# Patient Record
Sex: Female | Born: 1960 | Race: Black or African American | Hispanic: No | Marital: Single | Smoking: Never smoker
Health system: Southern US, Community
[De-identification: ages and names within clinical notes are randomized; demographics above are authoritative.]

## PROBLEM LIST (undated history)

## (undated) DIAGNOSIS — B009 Herpesviral infection, unspecified: Secondary | ICD-10-CM

## (undated) DIAGNOSIS — Z8679 Personal history of other diseases of the circulatory system: Secondary | ICD-10-CM

## (undated) DIAGNOSIS — S4991XA Unspecified injury of right shoulder and upper arm, initial encounter: Secondary | ICD-10-CM

## (undated) DIAGNOSIS — L309 Dermatitis, unspecified: Secondary | ICD-10-CM

## (undated) DIAGNOSIS — K219 Gastro-esophageal reflux disease without esophagitis: Secondary | ICD-10-CM

## (undated) DIAGNOSIS — A539 Syphilis, unspecified: Secondary | ICD-10-CM

## (undated) DIAGNOSIS — I10 Essential (primary) hypertension: Secondary | ICD-10-CM

## (undated) DIAGNOSIS — E785 Hyperlipidemia, unspecified: Secondary | ICD-10-CM

## (undated) DIAGNOSIS — F32A Depression, unspecified: Secondary | ICD-10-CM

## (undated) DIAGNOSIS — F329 Major depressive disorder, single episode, unspecified: Secondary | ICD-10-CM

## (undated) DIAGNOSIS — I052 Rheumatic mitral stenosis with insufficiency: Secondary | ICD-10-CM

## (undated) HISTORY — DX: Dermatitis, unspecified: L30.9

## (undated) HISTORY — DX: Rheumatic mitral stenosis with insufficiency: I05.2

## (undated) HISTORY — DX: Personal history of other diseases of the circulatory system: Z86.79

## (undated) HISTORY — DX: Unspecified injury of right shoulder and upper arm, initial encounter: S49.91XA

## (undated) HISTORY — DX: Essential (primary) hypertension: I10

## (undated) HISTORY — DX: Major depressive disorder, single episode, unspecified: F32.9

## (undated) HISTORY — DX: Syphilis, unspecified: A53.9

## (undated) HISTORY — DX: Hyperlipidemia, unspecified: E78.5

## (undated) HISTORY — DX: Depression, unspecified: F32.A

## (undated) HISTORY — DX: Gastro-esophageal reflux disease without esophagitis: K21.9

---

## 1981-03-30 HISTORY — PX: FEMUR FRACTURE SURGERY: SHX633

## 2004-01-29 HISTORY — PX: ABDOMINAL HYSTERECTOMY: SHX81

## 2007-05-01 HISTORY — PX: BREAST SURGERY: SHX581

## 2008-10-29 LAB — CONVERTED CEMR LAB: Pap Smear: NORMAL

## 2010-01-03 ENCOUNTER — Encounter: Payer: Self-pay | Admitting: Pulmonary Disease

## 2010-01-05 ENCOUNTER — Encounter: Payer: Self-pay | Admitting: Cardiology

## 2010-01-07 ENCOUNTER — Encounter: Payer: Self-pay | Admitting: Cardiology

## 2010-01-13 ENCOUNTER — Ambulatory Visit: Payer: Self-pay | Admitting: Radiology

## 2010-01-13 ENCOUNTER — Emergency Department (HOSPITAL_BASED_OUTPATIENT_CLINIC_OR_DEPARTMENT_OTHER): Admission: EM | Admit: 2010-01-13 | Discharge: 2010-01-13 | Payer: Self-pay | Admitting: Emergency Medicine

## 2010-01-15 ENCOUNTER — Ambulatory Visit: Payer: Self-pay | Admitting: Family

## 2010-01-15 DIAGNOSIS — I517 Cardiomegaly: Secondary | ICD-10-CM

## 2010-01-15 DIAGNOSIS — I509 Heart failure, unspecified: Secondary | ICD-10-CM | POA: Insufficient documentation

## 2010-01-15 DIAGNOSIS — J189 Pneumonia, unspecified organism: Secondary | ICD-10-CM

## 2010-01-15 DIAGNOSIS — J45901 Unspecified asthma with (acute) exacerbation: Secondary | ICD-10-CM | POA: Insufficient documentation

## 2010-01-17 ENCOUNTER — Telehealth: Payer: Self-pay | Admitting: Family

## 2010-01-17 ENCOUNTER — Ambulatory Visit (HOSPITAL_BASED_OUTPATIENT_CLINIC_OR_DEPARTMENT_OTHER): Admission: RE | Admit: 2010-01-17 | Discharge: 2010-01-17 | Payer: Self-pay | Admitting: Internal Medicine

## 2010-01-17 ENCOUNTER — Ambulatory Visit: Payer: Self-pay | Admitting: Interventional Radiology

## 2010-01-17 ENCOUNTER — Ambulatory Visit: Payer: Self-pay | Admitting: Family

## 2010-01-21 DIAGNOSIS — F329 Major depressive disorder, single episode, unspecified: Secondary | ICD-10-CM

## 2010-01-21 DIAGNOSIS — L259 Unspecified contact dermatitis, unspecified cause: Secondary | ICD-10-CM | POA: Insufficient documentation

## 2010-01-21 DIAGNOSIS — I10 Essential (primary) hypertension: Secondary | ICD-10-CM | POA: Insufficient documentation

## 2010-01-21 DIAGNOSIS — K219 Gastro-esophageal reflux disease without esophagitis: Secondary | ICD-10-CM | POA: Insufficient documentation

## 2010-01-22 ENCOUNTER — Encounter: Payer: Self-pay | Admitting: Cardiology

## 2010-01-22 ENCOUNTER — Ambulatory Visit: Payer: Self-pay | Admitting: Cardiology

## 2010-01-22 DIAGNOSIS — R011 Cardiac murmur, unspecified: Secondary | ICD-10-CM

## 2010-01-24 ENCOUNTER — Telehealth: Payer: Self-pay | Admitting: Family

## 2010-01-27 ENCOUNTER — Ambulatory Visit: Payer: Self-pay | Admitting: Diagnostic Radiology

## 2010-01-27 ENCOUNTER — Ambulatory Visit (HOSPITAL_BASED_OUTPATIENT_CLINIC_OR_DEPARTMENT_OTHER): Admission: RE | Admit: 2010-01-27 | Discharge: 2010-01-27 | Payer: Self-pay | Admitting: Internal Medicine

## 2010-01-27 ENCOUNTER — Telehealth: Payer: Self-pay | Admitting: Family

## 2010-01-27 ENCOUNTER — Ambulatory Visit: Payer: Self-pay | Admitting: Family

## 2010-01-31 ENCOUNTER — Encounter: Payer: Self-pay | Admitting: Cardiology

## 2010-01-31 ENCOUNTER — Ambulatory Visit (HOSPITAL_COMMUNITY): Admission: RE | Admit: 2010-01-31 | Discharge: 2010-01-31 | Payer: Self-pay | Admitting: Cardiology

## 2010-01-31 ENCOUNTER — Ambulatory Visit: Payer: Self-pay

## 2010-01-31 ENCOUNTER — Ambulatory Visit: Payer: Self-pay | Admitting: Cardiology

## 2010-02-03 LAB — CONVERTED CEMR LAB
BUN: 19 mg/dL (ref 6–23)
Calcium: 9.8 mg/dL (ref 8.4–10.5)
Creatinine, Ser: 0.87 mg/dL (ref 0.40–1.20)

## 2010-02-10 ENCOUNTER — Telehealth (INDEPENDENT_AMBULATORY_CARE_PROVIDER_SITE_OTHER): Payer: Self-pay | Admitting: *Deleted

## 2010-02-11 ENCOUNTER — Ambulatory Visit: Payer: Self-pay | Admitting: Pulmonary Disease

## 2010-02-11 ENCOUNTER — Encounter: Payer: Self-pay | Admitting: Pulmonary Disease

## 2010-02-12 ENCOUNTER — Encounter (INDEPENDENT_AMBULATORY_CARE_PROVIDER_SITE_OTHER): Payer: Self-pay | Admitting: *Deleted

## 2010-02-12 ENCOUNTER — Encounter: Payer: Self-pay | Admitting: Cardiology

## 2010-02-12 ENCOUNTER — Ambulatory Visit: Payer: Self-pay | Admitting: Cardiology

## 2010-02-12 DIAGNOSIS — E785 Hyperlipidemia, unspecified: Secondary | ICD-10-CM | POA: Insufficient documentation

## 2010-02-12 DIAGNOSIS — I052 Rheumatic mitral stenosis with insufficiency: Secondary | ICD-10-CM | POA: Insufficient documentation

## 2010-02-13 ENCOUNTER — Telehealth: Payer: Self-pay | Admitting: Family

## 2010-02-14 ENCOUNTER — Telehealth: Payer: Self-pay | Admitting: Family

## 2010-02-14 ENCOUNTER — Ambulatory Visit: Payer: Self-pay | Admitting: Family

## 2010-02-14 DIAGNOSIS — J40 Bronchitis, not specified as acute or chronic: Secondary | ICD-10-CM | POA: Insufficient documentation

## 2010-02-19 ENCOUNTER — Encounter: Payer: Self-pay | Admitting: Pulmonary Disease

## 2010-02-24 ENCOUNTER — Ambulatory Visit (HOSPITAL_COMMUNITY)
Admission: RE | Admit: 2010-02-24 | Discharge: 2010-02-24 | Payer: Self-pay | Source: Home / Self Care | Admitting: Cardiology

## 2010-02-24 ENCOUNTER — Ambulatory Visit: Payer: Self-pay | Admitting: Cardiology

## 2010-02-26 ENCOUNTER — Telehealth: Payer: Self-pay | Admitting: Family

## 2010-03-04 ENCOUNTER — Telehealth: Payer: Self-pay | Admitting: Family

## 2010-03-11 ENCOUNTER — Ambulatory Visit: Payer: Self-pay | Admitting: Family

## 2010-03-11 DIAGNOSIS — R05 Cough: Secondary | ICD-10-CM

## 2010-03-11 DIAGNOSIS — M25569 Pain in unspecified knee: Secondary | ICD-10-CM

## 2010-03-12 ENCOUNTER — Encounter (INDEPENDENT_AMBULATORY_CARE_PROVIDER_SITE_OTHER): Payer: Self-pay | Admitting: *Deleted

## 2010-03-12 LAB — CONVERTED CEMR LAB
ALT: 11 units/L (ref 0–35)
Alkaline Phosphatase: 59 units/L (ref 39–117)
Cholesterol: 177 mg/dL (ref 0–200)
Indirect Bilirubin: 0.3 mg/dL (ref 0.0–0.9)
Total Protein: 6.8 g/dL (ref 6.0–8.3)
Triglycerides: 79 mg/dL (ref <150)

## 2010-03-17 ENCOUNTER — Telehealth: Payer: Self-pay | Admitting: Family

## 2010-03-17 ENCOUNTER — Ambulatory Visit: Payer: Self-pay | Admitting: Family

## 2010-03-17 ENCOUNTER — Ambulatory Visit (HOSPITAL_BASED_OUTPATIENT_CLINIC_OR_DEPARTMENT_OTHER)
Admission: RE | Admit: 2010-03-17 | Discharge: 2010-03-17 | Payer: Self-pay | Source: Home / Self Care | Attending: Internal Medicine | Admitting: Internal Medicine

## 2010-03-18 ENCOUNTER — Ambulatory Visit: Payer: Self-pay | Admitting: Pulmonary Disease

## 2010-03-19 ENCOUNTER — Telehealth: Payer: Self-pay | Admitting: Internal Medicine

## 2010-03-19 ENCOUNTER — Telehealth: Payer: Self-pay | Admitting: Pulmonary Disease

## 2010-03-21 ENCOUNTER — Ambulatory Visit: Payer: Self-pay | Admitting: Family

## 2010-03-25 ENCOUNTER — Inpatient Hospital Stay (HOSPITAL_COMMUNITY)
Admission: AD | Admit: 2010-03-25 | Discharge: 2010-03-28 | Payer: Self-pay | Source: Home / Self Care | Attending: Cardiology | Admitting: Cardiology

## 2010-03-25 ENCOUNTER — Emergency Department (HOSPITAL_BASED_OUTPATIENT_CLINIC_OR_DEPARTMENT_OTHER)
Admission: EM | Admit: 2010-03-25 | Discharge: 2010-03-25 | Payer: Self-pay | Source: Home / Self Care | Admitting: Emergency Medicine

## 2010-03-27 ENCOUNTER — Encounter: Payer: Self-pay | Admitting: Cardiology

## 2010-04-09 ENCOUNTER — Ambulatory Visit
Admission: RE | Admit: 2010-04-09 | Discharge: 2010-04-09 | Payer: Self-pay | Source: Home / Self Care | Attending: Cardiology | Admitting: Cardiology

## 2010-04-09 ENCOUNTER — Encounter: Payer: Self-pay | Admitting: Family

## 2010-04-09 ENCOUNTER — Encounter: Payer: Self-pay | Admitting: Cardiology

## 2010-04-09 ENCOUNTER — Ambulatory Visit (HOSPITAL_BASED_OUTPATIENT_CLINIC_OR_DEPARTMENT_OTHER)
Admission: RE | Admit: 2010-04-09 | Discharge: 2010-04-09 | Payer: Self-pay | Source: Home / Self Care | Attending: Cardiology | Admitting: Cardiology

## 2010-04-18 LAB — CONVERTED CEMR LAB
BUN: 22 mg/dL (ref 6–23)
CO2: 28 meq/L (ref 19–32)
Chloride: 98 meq/L (ref 96–112)
Creatinine, Ser: 1.08 mg/dL (ref 0.40–1.20)
Sed Rate: 18 mm/hr (ref 0–22)

## 2010-04-28 ENCOUNTER — Ambulatory Visit
Admission: RE | Admit: 2010-04-28 | Discharge: 2010-04-28 | Payer: Self-pay | Source: Home / Self Care | Attending: Family | Admitting: Family

## 2010-04-29 NOTE — Assessment & Plan Note (Signed)
Summary: 4 DAY FOLLOW UP/MHF rsch per pt/dt--Rm 5   Vital Signs:  Patient profile:   50 year old female Menstrual status:  partial hysterectomy Height:      62 inches Weight:      173.75 pounds BMI:     31.89 O2 Sat:      98 % on Room air Temp:     97.7 degrees F oral Pulse rate:   78 / minute Pulse rhythm:   regular Resp:     24 per minute BP sitting:   126 / 76  (right arm) Cuff size:   large  Vitals Entered By: Mervin Kung CMA (AAMA) (January 27, 2010 8:14 AM)  O2 Flow:  Room air CC: Rm 5  4 day follow up. Still has cough and rib pain. Is Patient Diabetic? No Pain Assessment Patient in pain? yes      Comments Pt agrees all med doses and directions are correct. Nicki Guadalajara Fergerson CMA (AAMA)  January 27, 2010 8:20 AM    Visit Type:  follow up Primary Care Provider:  Lemont Fillers FNP  CC:  Rm 5  4 day follow up. Still has cough and rib pain.Marland Kitchen  History of Present Illness: Ms Kulzer is a 50 year old female who presents today for follow up.  She had a CXR on 10/21 which showed bilateral pneumonia.  She was acutely wheezing at that time and was treated with a prednisone taper and antibiotics.  She completed the abx on 10/19.  She denies chills or fever.  Denies fevers.  Notes clear sputum, foam.  Thick x the last few days.  Still having cough and rib pain and pain beneath the ribs and around her back.  This pain is worse with cough.   Notes that her cough was present prior to initiation of ACE in [place].  She saw Dr. Jens Som last week and her zestril was increased from 20 to 40 mg.  She has continued furosemide and has lost 4 pounds since her initial visit on 10/19.    Allergies (verified): No Known Drug Allergies  Past History:  Past Medical History: Last updated: 01/22/2010 HYPERTENSION Hyperlipidemia ECZEMA DEPRESSION GERD  ASTHMA Hx of Rheumatic fever-- murmur Hx of right shoulder injury  Past Surgical History: Last updated: 01/15/2010 Right femur  fracture--1983 C section -- 1991 Partial Hysterectomy-- 01/2004 Left breast biopsy (benign)-- 05/2007  Family History: Reviewed history from 01/15/2010 and no changes required. Mother--living, renal failure, heart disease, thyroid disease, diabetes, colon polyps  Father--deceased, stroke; heart disease 1 brother--alive and well 1 brother deceased from complications or cellulitis 3 children-- alive and well  [place]- age 85 [name]-23 [name]-20- [name]  Social History: Reviewed history from 01/15/2010 and no changes required. 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 No apparent alopecia or balding. Lungs:  Normal respiratory effort, chest expands symmetrically. Lungs are clear to auscultation, no crackles or wheezes. Heart:  Normal rate and regular rhythm. S1 and S2 normal without gallop, murmur, click, rub or other extra sounds. Extremities:  No peripheral edema Psych:  Cognition and judgment appear intact. Alert and cooperative with normal attention span and concentration. No apparent delusions, illusions, hallucinations   Impression &  Recommendations:  Problem # 1:  CHF (ICD-428.0) Assessment Improved Patient's lungs are clear, appears euvolemic.  Continue ACE, furosemide, beta blocker.  Pt completed a follow up echo last week- results are pending.  May need valve replacement.  Will defer management to cardiology.  She has been scheduled for follow up lab work on Friday of this week by Dr. Jens Som.  (BMET) Her updated medication list for this problem includes:    Zestril 40 Mg Tabs (Lisinopril) ..... One tablet by mouth once daily     Metoprolol Tartrate 25 Mg Tabs (Metoprolol tartrate) .Marland Kitchen... Take 1 tablet by mouth twice a day    Ecotrin Low Strength 81 Mg Tbec (Aspirin) .Marland Kitchen... Take 1 tablet by mouth once a day.    Furosemide 40 Mg Tabs (Furosemide) ..... One tablet by mouth once daily  Problem # 2:  PNEUMONIA, BILATERAL (ICD-486) Assessment: Improved Clinically improved.  F/u cxr to ensure clearing. Orders: CXR- 2view (CXR)  Problem # 3:  ASTHMA, WITH ACUTE EXACERBATION (ICD-493.92) Assessment: Improved No wheezing today. Continue advair.  Pt continues to cough.  Notes + post nasal drip.  Recommended that she resume the flonase and stop the mucinex.  Continue PPI.  Plan tussionex at bedtime short term and ibuprofen for muscle pain PRN short term.  Consider PFT's next visit.   Her updated medication list for this problem includes:    Albuterol Sulfate (2.5 Mg/29ml) 0.083% Nebu (Albuterol sulfate) ..... Use 1 vial in nebulizer every 4 hours as needed.    Advair Hfa 115-21 Mcg/act Aero (Fluticasone-salmeterol) .Marland Kitchen... 2 puffs two times a day.    Proair Hfa 108 (90 Base) Mcg/act Aers (Albuterol sulfate) .Marland Kitchen... As needed  Complete Medication List: 1)  Soma 350 Mg Tabs (Carisoprodol) .... Take 1 tablet every 6 hours. 2)  Prozac 40 Mg Caps (Fluoxetine hcl) .... Take 1 capsule by mouth once a day. 3)  Estradiol 1 Mg Tabs (Estradiol) .... Take 1 tablet by mouth once a day. 4)  Flonase 50 Mcg/act Susp (Fluticasone propionate) .Marland Kitchen.. 1spray each nostril daily as needed. 5)  Albuterol Sulfate (2.5 Mg/25ml) 0.083% Nebu (Albuterol sulfate) .... Use 1 vial in nebulizer every 4 hours as needed. 6)  Advair Hfa 115-21 Mcg/act Aero (Fluticasone-salmeterol) .... 2 puffs two times a day. 7)  Zestril 40 Mg Tabs (Lisinopril) .... One tablet by mouth once daily 8)  Zocor 10 Mg Tabs (Simvastatin) .... Take 1 tablet by mouth once a day. 9)  Metoprolol Tartrate 25 Mg Tabs (Metoprolol tartrate) .... Take 1 tablet by mouth twice a day 10)  Ecotrin Low  Strength 81 Mg Tbec (Aspirin) .... Take 1 tablet by mouth once a day. 11)  Furosemide 40 Mg Tabs (Furosemide) .... One tablet by mouth once daily 12)  Nexium 40 Mg Cpdr (Esomeprazole magnesium) .... Take 1 capsule by mouth once a day 13)  Proair Hfa 108 (90 Base) Mcg/act Aers (Albuterol sulfate) .... As needed 14)  Tussionex Pennkinetic Er 10-8 Mg/15ml Lqcr (Hydrocod polst-chlorphen polst) .... One teaspoon every 12 hours as needed for severe cough.  do not drive while taking this medication  Patient Instructions: 1)  Please complete your chest x-ray today. 2)  Complete your lab work on Friday as scheduled.   3)  Take 400-600mg  of Ibuprofen (Advil, Motrin) with food every 4-6 hours as needed for relief of pain. 4)  Resume flonase. 5)  Please schedule a follow-up appointment in 1 month. Prescriptions: FUROSEMIDE 40 MG TABS (FUROSEMIDE) one tablet by mouth once  daily  #30 x 2   Entered and Authorized by:   Lemont Fillers FNP   Signed by:   Lemont Fillers FNP on 01/27/2010   Method used:   Electronically to        [facility] # [phone]* (retail)       [address]       [place], [place]  [address]       Ph: [phone]       Fax: [phone]   RxID:   [id number] FLONASE 50 MCG/ACT SUSP (FLUTICASONE PROPIONATE) 1spray each nostril daily as needed.  #1 x 3   Entered and Authorized by:   Lemont Fillers FNP   Signed by:   Lemont Fillers FNP on 01/27/2010   Method used:   Electronically to        [facility] # [phone]* (retail)       [address]       [place], [place]  [address]       Ph: [phone]       Fax: [phone]   RxID:   [id number] Sandria Senter ER 10-8 MG/5ML LQCR (HYDROCOD POLST-CHLORPHEN POLST) one teaspoon every 12 hours as needed for severe cough.  Do not drive while taking this medication  #150 x 0   Entered and Authorized by:   Lemont Fillers FNP   Signed by:    Lemont Fillers FNP on 01/27/2010   Method used:   Print then Give to Patient   RxID:   [id number]    Orders Added: 1)  CXR- 2view [CXR] 2)  Est. Patient Level III [20254]    Current Allergies (reviewed today): No known allergies

## 2010-04-29 NOTE — Progress Notes (Signed)
Summary: cough  Phone Note Call from Patient Call back at [phone]   Caller: Patient Call For: Lemont Fillers FNP Summary of Call: Received call from pt stating her cough has returned though not as bad as before, not having pain. Cough is productive of yellow sputum. Pt denies fever. Pt wants to know what she should take OTC or does she need a prescription. Pt doesn't want to get back in the condition she was before. Pt states she has upcoming cardiac procedure and wants to better for that test.  Mervin Kung CMA Duncan Dull)  February 14, 2010 11:25 AM   Follow-up for Phone Call        She needs OV please for further evaluation. Follow-up by: Lemont Fillers FNP,  February 14, 2010 11:35 AM  Additional Follow-up for Phone Call Additional follow up Details #1::        Pt notified and will see Melissa today at 1:30pm Mervin Kung CMA Duncan Dull)  February 14, 2010 11:42 AM

## 2010-04-29 NOTE — Assessment & Plan Note (Signed)
Summary: 2 day follow up/mhf--Rm 5   Vital Signs:  Patient profile:   50 year old female Menstrual status:  partial hysterectomy Height:      62 inches Weight:      177.50 pounds BMI:     32.58 O2 Sat:      95 % on Room air Temp:     97.9 degrees F oral Pulse rate:   78 / minute Pulse rhythm:   regular Resp:     26 per minute BP sitting:   144 / 96  (right arm) Cuff size:   large  Vitals Entered By: Mervin Kung CMA Duncan Dull) (January 17, 2010 8:55 AM)  O2 Flow:  Room air CC: Rm 5  2 day follow up.  Pt now has constant pain under right rib since last visit. Is Patient Diabetic? No Pain Assessment Patient in pain? yes     Location: right rib Intensity: 10 Type: constant, sharp Onset of pain  2 days ago after hard coughing spell. Comments Pt thinks Metoprolol bottle says take 1 every 8 hours. She will check this at home and call us back. Pt agrees all other med doses and directions are correct. Nicki Guadalajara Fergerson CMA (AAMA)  January 17, 2010 9:05 AM.    Primary Care Provider:  Lemont Fillers FNP  CC:  Rm 5  2 day follow up.  Pt now has constant pain under right rib since last visit.Marland Kitchen  History of Present Illness: Julia Garza is a 50 year old female who presents today for follow up of her pneumonia and CHF.  Last visit she was started on Ceftin and Zithromax as well as lasix.  She had recently completed a BNP in the ED which was elevated at 610.  Since she was here last, she denies fever.  She notes that she was short of breath yesterday.  Today, she feels better.  Taking antibiotics and Furosemide as prescibed.  Her chief complaint today is severe pain at the base of the right lung.  She has run out of the pain medication which was given by the ER physician and is requesting a refill.    Allergies (verified): No Known Drug Allergies  Past History:  Past Surgical History: Last updated: 01/15/2010 Right femur fracture--1983 C section -- 1991 Partial Hysterectomy--  01/2004 Left breast biopsy (benign)-- 05/2007  Review of Systems       see HPI  Physical Exam  General:  Slightly uncomfortable appearing AA female who is in no acute distress.  Pleasant, awake, and alert Head:  Normocephalic and atraumatic without obvious abnormalities. No apparent alopecia or balding. Neck:  No deformities, masses, or tenderness noted. Lungs:  Diffuse bilateral crackles.  Diminished breath sounds at the bases, scattered exp. wheezes.   Heart:  Normal rate and regular rhythm. S1 and S2 normal without gallop, murmur, click, rub or other extra sounds. Abdomen:  Bowel sounds positive,abdomen soft and non-tender without masses, organomegaly or hernias noted. Extremities:  No peripheral edema is noted. Skin:  Intact without suspicious lesions or rashes Psych:  Oriented X3, memory intact for recent and remote, normally interactive, good eye contact, not anxious appearing, and not depressed appearing.     Impression & Recommendations:  Problem # 1:  CHF (ICD-428.0) Assessment Unchanged She is s/p 2 doses of furosemide.  Her weight is unchanged from 48 hours ago.  Will repeat BNP today as well as CXR.  She is scheduled to establish with Dr. Jens Som next  week.  Given elevated BNP and persistent crackles on lung exam, will continue lasix 40mg  by mouth daily until she sees Dr. Jens Som.   Her updated medication list for this problem includes:    Zestril 20 Mg Tabs (Lisinopril) .Marland Kitchen... Take 1 tablet by mouth once a day    Metoprolol Tartrate 25 Mg Tabs (Metoprolol tartrate) .Marland Kitchen... Take 1 tablet by mouth twice a day    Ecotrin Low Strength 81 Mg Tbec (Aspirin) .Marland Kitchen... Take 1 tablet by mouth once a day.    Furosemide 40 Mg Tabs (Furosemide) ..... One tablet by mouth once daily  Orders: CXR- 2view (CXR) T-BNP  (B Natriuretic Peptide) ([id number])  Problem # 2:  PNEUMONIA, BILATERAL (ICD-486) Assessment: Unchanged CXR performed today shows little improvement of the bilateral  pneumonia.  Her oxygen saturation , however is slightly improved today at 95% and she is afebrile.  Will plan to continue antibiotics and proair.  Rib pain is likely musculoskeletal in origin. An rx has been provided for vicodin. Her updated medication list for this problem includes:    Ceftin 500 Mg Tabs (Cefuroxime axetil) ..... One tablet by mouth two times a day x 7 days    Zithromax Z-pak 250 Mg Tabs (Azithromycin) .Marland Kitchen... 2 tablet by mouth today, then one tablet by mouth daily for 4 more days  Orders: CXR- 2view (CXR)  Complete Medication List: 1)  Soma 350 Mg Tabs (Carisoprodol) .... Take 1 tablet every 6 hours. 2)  Prozac 40 Mg Caps (Fluoxetine hcl) .... Take 1 capsule by mouth once a day. 3)  Estradiol 1 Mg Tabs (Estradiol) .... Take 1 tablet by mouth once a day. 4)  Flonase 50 Mcg/act Susp (Fluticasone propionate) .Marland Kitchen.. 1spray each nostril daily as needed. 5)  Albuterol Sulfate (2.5 Mg/79ml) 0.083% Nebu (Albuterol sulfate) .... Use 1 vial in nebulizer every 4 hours as needed. 6)  Advair Hfa 115-21 Mcg/act Aero (Fluticasone-salmeterol) .... 2 puffs two times a day. 7)  Hydrocodone-acetaminophen 5-500 Mg Tabs (Hydrocodone-acetaminophen) .Marland Kitchen.. 1 tablet by mouth every 4-6 hours as needed for severe pain 8)  Zestril 20 Mg Tabs (Lisinopril) .... Take 1 tablet by mouth once a day 9)  Zocor 10 Mg Tabs (Simvastatin) .... Take 1 tablet by mouth once a day. 10)  Metoprolol Tartrate 25 Mg Tabs (Metoprolol tartrate) .... Take 1 tablet by mouth twice a day 11)  Ecotrin Low Strength 81 Mg Tbec (Aspirin) .... Take 1 tablet by mouth once a day. 12)  Mucinex Dm Maximum Strength 60-1200 Mg Xr12h-tab (Dextromethorphan-guaifenesin) .... Take 1 tablet by mouth two times a day. 13)  Ceftin 500 Mg Tabs (Cefuroxime axetil) .... One tablet by mouth two times a day x 7 days 14)  Zithromax Z-pak 250 Mg Tabs (Azithromycin) .... 2 tablet by mouth today, then one tablet by mouth daily for 4 more days 15)  Prednisone 10  Mg Tabs (Prednisone) .... Take 4 tablets daily x 2 days, then 3 tabs daily x 2 days, then 2 tabs daily x 2 days, then 1 tab daily for 2 days then stop. 16)  Furosemide 40 Mg Tabs (Furosemide) .... One tablet by mouth once daily  Patient Instructions: 1)  Please complete your labs and chest x-ray downstairs today. 2)  Follow up next wednesday, sooner if symptoms worsen. Prescriptions: HYDROCODONE-ACETAMINOPHEN 5-500 MG TABS (HYDROCODONE-ACETAMINOPHEN) 1 tablet by mouth every 4-6 hours as needed for severe pain  #30 x 0   Entered and Authorized by:   Lemont Fillers FNP   Signed by:  Lemont Fillers FNP on 01/17/2010   Method used:   Print then Give to Patient   RxID:   [id number]    Orders Added: 1)  CXR- 2view [CXR] 2)  T-BNP  (B Natriuretic Peptide) [[id number]] 3)  Est. Patient Level III [78469]    Current Allergies (reviewed today): No known allergies

## 2010-04-29 NOTE — Progress Notes (Signed)
Summary: Needs metoprolol clarification  Phone Note Call from Patient   Caller: Patient Call For: Lemont Fillers FNP Summary of Call: Pt called back and said Metoprolol bottle says to take 1 tablet every 8 hours. States this was filled at hospital in [place]. She has been taking 1 tablet twice a day. Please advise. Nicki Guadalajara Fergerson CMA Sharra Cayabyab Dull)  January 17, 2010 10:38 AM   Follow-up for Phone Call        She can continue twice a day for now.  I will defer any changes to Dr. Jens Som next week. Follow-up by: Lemont Fillers FNP,  January 17, 2010 10:47 AM  Additional Follow-up for Phone Call Additional follow up Details #1::        Pt notified and voices understanding. Nicki Guadalajara Fergerson CMA Evamae Rowen Dull)  January 17, 2010 11:48 AM     New/Updated Medications: METOPROLOL TARTRATE 25 MG TABS (METOPROLOL TARTRATE) Take 1 tablet by mouth twice a day

## 2010-04-29 NOTE — Progress Notes (Signed)
Summary: Nexium alternative?  Phone Note Call from Patient Call back at [phone]   Caller: Patient Call For: Lemont Fillers FNP Summary of Call: Received voice message from pt stating she has completed Nexium rx and would like to try something OTC due to cost. What do you recommend? Nicki Guadalajara Fergerson CMA Duncan Dull)  February 13, 2010 9:29 AM   Follow-up for Phone Call        Try prilosec otc instead, 20mg . Follow-up by: Lemont Fillers FNP,  February 13, 2010 10:09 AM  Additional Follow-up for Phone Call Additional follow up Details #1::        Pt advised. Nicki Guadalajara Fergerson CMA Duncan Dull)  February 13, 2010 10:27 AM     New/Updated Medications: PRILOSEC OTC 20 MG TBEC (OMEPRAZOLE MAGNESIUM) one tablet by mouth daily

## 2010-04-29 NOTE — Progress Notes (Signed)
----   Converted from flag ---- ---- 01/17/2010 8:50 AM, Mervin Kung CMA (AAMA) wrote: It was faxed on 01/15/10.  ---- 01/17/2010 8:47 AM, Lemont Fillers FNP wrote: Have we requester her hospitalization records? thanks ------------------------------

## 2010-04-29 NOTE — Assessment & Plan Note (Signed)
Summary: [facility]   Visit Type:  Initial Consult Primary Provider:  Lemont Fillers FNP  CC:  CHF.  History of Present Illness: 50 year old female for evaluation of  congestive heart failure and a murmur. Patient states that she has been diagnosed with asthma for the past 2 years. She has been treated with bronchodilators. She has had dyspnea on exertion as well as orthopnea but no PND or pedal edema. There has been no chest pain. Her dyspnea worsened over the past 2 months. Apparently hospitalized  in [place] in early October for possible pneumonia and congestive heart failure. She was told she had a valve problem and may require surgery. Records are not available. She was seen in the emergency room in October for possible pneumonia and congestive heart failure. A BNP was elevated at 610. A chest x-ray showed bilateral perihilar pneumonia. Patient has been treated with antibiotics and Lasix and we are now asked to further evaluate. Her dyspnea has improved but there is still mild dyspnea on exertion. She has had a cough that increases with lying flat. She does feel like she had a URI previously.  Current Medications (verified): 1)  Soma 350 Mg Tabs (Carisoprodol) .... Take 1 Tablet Every 6 Hours. 2)  Prozac 40 Mg Caps (Fluoxetine Hcl) .... Take 1 Capsule By Mouth Once A Day. 3)  Estradiol 1 Mg Tabs (Estradiol) .... Take 1 Tablet By Mouth Once A Day. 4)  Flonase 50 Mcg/act Susp (Fluticasone Propionate) .Marland Kitchen.. 1spray Each Nostril Daily As Needed. 5)  Albuterol Sulfate (2.5 Mg/63ml) 0.083% Nebu (Albuterol Sulfate) .... Use 1 Vial in Nebulizer Every 4 Hours As Needed. 6)  Advair Hfa 115-21 Mcg/act Aero (Fluticasone-Salmeterol) .... 2 Puffs Two Times A Day. 7)  Hydrocodone-Acetaminophen 5-500 Mg Tabs (Hydrocodone-Acetaminophen) .Marland Kitchen.. 1 Tablet By Mouth Every 4-6 Hours As Needed For Severe Pain 8)  Zestril 20 Mg Tabs (Lisinopril) .... Take 1 Tablet By Mouth Once A Day 9)  Zocor 10 Mg Tabs  (Simvastatin) .... Take 1 Tablet By Mouth Once A Day. 10)  Metoprolol Tartrate 25 Mg Tabs (Metoprolol Tartrate) .... Take 1 Tablet By Mouth Twice A Day 11)  Ecotrin Low Strength 81 Mg Tbec (Aspirin) .... Take 1 Tablet By Mouth Once A Day. 12)  Furosemide 40 Mg Tabs (Furosemide) .... One Tablet By Mouth Once Daily 13)  Nexium 40 Mg Cpdr (Esomeprazole Magnesium) .... Take 1 Capsule By Mouth Once A Day 14)  Proair Hfa 108 (90 Base) Mcg/act Aers (Albuterol Sulfate) .... As Needed 15)  Mucus Relief Dm Cough 20-400 Mg Tabs (Dextromethorphan-Guaifenesin) .... As Needed  Allergies (verified): No Known Drug Allergies  Past History:  Past Medical History: HYPERTENSION Hyperlipidemia ECZEMA DEPRESSION GERD  ASTHMA Hx of Rheumatic fever-- murmur Hx of right shoulder injury  Past Surgical History: Reviewed history from 01/15/2010 and no changes required. Right femur fracture--1983 C section -- 1991 Partial Hysterectomy-- 01/2004 Left breast biopsy (benign)-- 05/2007  Family History: Reviewed history from 01/15/2010 and no changes required. Mother--living, renal failure, heart disease, thyroid disease, diabetes, colon polyps  Father--deceased, stroke; heart disease 1 brother--alive and well 1 brother deceased from complications or cellulitis 3 children-- alive and well  [place]- age 12 [name]-23 [name]-20- [name]  Social History: Reviewed history from 01/15/2010 and no changes required. 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 Remaining systems are negative.   Vital Signs:  Patient profile:   50 year old female Menstrual status:  partial hysterectomy Height:      62  inches Weight:      174 pounds BMI:     31.94 Pulse rate:   81 / minute Pulse rhythm:   regular Resp:     18 per minute BP sitting:   128 / 94  (left arm) Cuff size:   large  Vitals Entered By: Vikki Ports (January 22, 2010 10:22 AM)  Physical Exam  General:  Well developed/well nourished in NAD Skin warm/dry Patient not depressed No peripheral clubbing Back-normal HEENT-normal/normal eyelids Neck supple/normal carotid upstroke bilaterally; no bruits; no JVD; no thyromegaly chest - mild rhonchi/ normal expansion CV - RRR/normal S1 and S2; no  rubs or gallops;  PMI nondisplaced; 3/6 systolic murmur at the apex that radiates to the left axilla. Abdomen -NT/ND, no HSM, no mass, + bowel sounds, no bruit 2+ femoral pulses, no bruits Ext-no edema, chords, 2+ DP Neuro-grossly nonfocal     EKG  Procedure date:  01/22/2010  Findings:      Normal sinus rhythm, left atrial enlargement.  Impression & Recommendations:  Problem # 1:  CARDIAC MURMUR (ICD-785.2) Patient has a mitral regurgitation murmur on examination and also has a history of rheumatic fever. Some of her dyspnea is most likely cardiac related particularly in light of elevated BNP. We will obtain all of her records from [place]. Repeat echocardiogram. Continue Lasix. Increase Zestril to 40 mg p.o. daily. Check potassium and renal function in one week. We will make further recommendations based on previous evaluation and repeat echocardiogram. Certainly if she has severe mitral regurgitation she may require mitral valve repair. Her updated medication list for this problem includes:    Zestril 20 Mg Tabs (Lisinopril) .Marland Kitchen... Take 1 tablet by mouth once a day    Metoprolol Tartrate 25 Mg Tabs (Metoprolol tartrate) .Marland Kitchen... Take 1 tablet by mouth twice a day    Furosemide 40 Mg Tabs (Furosemide) ..... One tablet by mouth once daily  Her updated medication list for this problem includes:    Zestril 20 Mg Tabs (Lisinopril)  .Marland Kitchen... Take 1 tablet by mouth once a day    Metoprolol Tartrate 25 Mg Tabs (Metoprolol tartrate) .Marland Kitchen... Take 1 tablet by mouth twice a day    Furosemide 40 Mg Tabs (Furosemide) ..... One tablet by mouth once daily  Orders: Echocardiogram (Echo)  Problem # 2:  CHF (ICD-428.0) Patient appears euvolemic on examination. Continue present dose of diuretics. Her updated medication list for this problem includes:    Zestril 40 Mg Tabs (Lisinopril) ..... One tablet by mouth once daily    Metoprolol Tartrate 25 Mg Tabs (Metoprolol tartrate) .Marland Kitchen... Take 1 tablet by mouth twice a day    Ecotrin Low Strength 81 Mg Tbec (Aspirin) .Marland Kitchen... Take 1 tablet by mouth once a day.    Furosemide 40 Mg Tabs (Furosemide) ..... One tablet by mouth once daily  Her updated medication list for this problem includes:    Zestril 40 Mg Tabs (Lisinopril) ..... One tablet by mouth once daily    Metoprolol Tartrate 25 Mg Tabs (Metoprolol tartrate) .Marland Kitchen... Take 1 tablet by mouth twice a day    Ecotrin Low Strength 81 Mg Tbec (Aspirin) .Marland Kitchen... Take 1 tablet by mouth once a day.  Furosemide 40 Mg Tabs (Furosemide) ..... One tablet by mouth once daily  Problem # 3:  HYPERTENSION (ICD-401.9) Her blood pressure is elevated. Given mitral regurgitation murmur on examination we will need to control this. Increase Zestril as described above. Her updated medication list for this problem includes:    Zestril 40 Mg Tabs (Lisinopril) ..... One tablet by mouth once daily    Metoprolol Tartrate 25 Mg Tabs (Metoprolol tartrate) .Marland Kitchen... Take 1 tablet by mouth twice a day    Ecotrin Low Strength 81 Mg Tbec (Aspirin) .Marland Kitchen... Take 1 tablet by mouth once a day.    Furosemide 40 Mg Tabs (Furosemide) ..... One tablet by mouth once daily  Orders: T-Basic Metabolic Panel [phone])  Orders: T-Basic Metabolic Panel [id number])  Problem # 4:  GERD (ICD-530.81)  Her updated medication list for this problem includes:    Nexium 40 Mg Cpdr  (Esomeprazole magnesium) .Marland Kitchen... Take 1 capsule by mouth once a day  Problem # 5:  ASTHMA, WITH ACUTE EXACERBATION (ICD-493.92) Continue present medications. However I wonder if some of shortness of breath is more related to congestive heart failure and mitral regurgitation than asthma. The following medications were removed from the medication list:    Prednisone 10 Mg Tabs (Prednisone) .Marland Kitchen... Take 4 tablets daily x 2 days, then 3 tabs daily x 2 days, then 2 tabs daily x 2 days, then 1 tab daily for 2 days then stop. Her updated medication list for this problem includes:    Albuterol Sulfate (2.5 Mg/55ml) 0.083% Nebu (Albuterol sulfate) ..... Use 1 vial in nebulizer every 4 hours as needed.    Advair Hfa 115-21 Mcg/act Aero (Fluticasone-salmeterol) .Marland Kitchen... 2 puffs two times a day.    Proair Hfa 108 (90 Base) Mcg/act Aers (Albuterol sulfate) .Marland Kitchen... As needed  Patient Instructions: 1)  Your physician recommends that you schedule a follow-up appointment in: 2 WEEKS 2)  Your physician recommends that you return for lab work in:ONE WEEK 3)  Your physician has recommended you make the following change in your medication: INCREASE ZESTRIL 40MG  ONCE DAILY 4)  Your physician has requested that you have an echocardiogram.  Echocardiography is a painless test that uses sound waves to create images of your heart. It provides your doctor with information about the size and shape of your heart and how well your heart's chambers and valves are working.  This procedure takes approximately one hour. There are no restrictions for this procedure. Prescriptions: ZESTRIL 40 MG TABS (LISINOPRIL) one tablet by mouth once daily  #30 x 12   Entered by:   Deliah Goody, RN   Authorized by:   Ferman Hamming, MD, [facility]   Signed by:   Deliah Goody, RN on 01/22/2010   Method used:   Electronically to        [facility] # [phone]* (retail)       [address]       [place], [place]   [address]       Ph: [phone]       Fax: [phone]   RxID:   [id number]

## 2010-04-29 NOTE — Progress Notes (Signed)
Summary: hydrocodone request  Phone Note Call from Patient   Caller: Patient Call For: Lemont Fillers FNP Summary of Call: Received voice message from pt requesting refill on Hydrocodone. Advised pt per Kabella Cassidy's instructions that Hydrocodone was intended as a 1 time rx, not to be continued long term. Advised pt to take Tylenol or Ibuprofen OTC and keep f/u on Monday. Pt voices understanding. Nicki Guadalajara Fergerson CMA [facility]ull)  January 24, 2010 2:05 PM

## 2010-04-29 NOTE — Letter (Signed)
Summary: TEE Instructions  [facility]  [address]   [place], [place] [address]   Phone: [phone]  Fax:       TEE Instructions  02/12/2010 MRN: [id number]  [facility] [address] [place], [place]  [address]      You are scheduled for a TEE on  MONDAY 02-24-10 with Dr. Jens Som  Please arrive at the [facility] at 1 p.m. on the day of your procedure.  1)   Diet:     A)   Nothing to eat or drink after midnight except your medications with        a sip of water.    B)   May have clear liquid breakfast.  Clear liquids include:  water, broth,        Sprite, Ginger Ale, black cofee, tea (no sugar), cranberry / grape /        apple juice, jello (not red), popsicle from clear juices (not red).  2)  Must have a responsible person to drive you home.  3)   Bring your current insurance cards and current list of all your medications.   *Special Note:  Every effort is made to have your procedure done on time.  Occasionally there are emergencies that present themselves at the hospital that may cause delays.  Please be patient if a delay does occur.  *If you have any questions after you get home, please call the office at [phone].

## 2010-04-29 NOTE — Assessment & Plan Note (Signed)
Summary: [facility]   Visit Type:  Follow-up Primary Provider:  Lemont Fillers FNP  CC:  follow up echo.  History of Present Illness: 50 year old female I initially saw in Oct 2011 for evaluation of  congestive heart failure and a murmur. Patient states that she has been diagnosed with asthma for the past 2 years. She has been treated with bronchodilators. She has had dyspnea on exertion as well as orthopnea but no PND or pedal edema. There has been no chest pain. Her dyspnea worsened over the past 2 months. Apparently hospitalized  in [place] in early October for possible pneumonia and congestive heart failure. She was told she had a valve problem and may require surgery. Records are not available. She was seen in the emergency room in October for possible pneumonia and congestive heart failure. A BNP was elevated at 610. A chest x-ray showed bilateral perihilar pneumonia. Patient has been treated with antibiotics. When I saw her previously we scheduled an echocardiogram which was performed in November of 2011 and revealed normal LV function, moderate left atrial enlargement, mild right atrial enlargement, moderate mitral regurgitation and mild to moderate mitral stenosis; mild AI. Since I last saw her, she is improved. She denies dyspnea on exertion, orthopnea, PND, pedal edema, palpitations, syncope or chest pain. She does complain of increased cough with lying flat.   Current Medications (verified): 1)  Soma 350 Mg Tabs (Carisoprodol) .... Take 1 Tablet Every 6 Hours. 2)  Prozac 40 Mg Caps (Fluoxetine Hcl) .... Take 1 Capsule By Mouth Once A Day. 3)  Flonase 50 Mcg/act Susp (Fluticasone Propionate) .Marland Kitchen.. 1spray Each Nostril Daily As Needed. 4)  Albuterol Sulfate (2.5 Mg/66ml) 0.083% Nebu (Albuterol Sulfate) .... Use 1 Vial in Nebulizer Every 4 Hours As Needed. 5)  Zestril 40 Mg Tabs (Lisinopril) .... One Tablet By Mouth Once Daily 6)  Zocor 10 Mg Tabs (Simvastatin) .... Take 1  Tablet By Mouth Once A Dayran Out Last Dose 3 Days Ago 7)  Metoprolol Tartrate 25 Mg Tabs (Metoprolol Tartrate) .... Take 1 Tablet By Mouth Twice A Day 8)  Ecotrin Low Strength 81 Mg Tbec (Aspirin) .... Take 1 Tablet By Mouth Once A Day. 9)  Furosemide 40 Mg Tabs (Furosemide) .... One Tablet By Mouth Once Daily 10)  Nexium 40 Mg Cpdr (Esomeprazole Magnesium) .... Take 1 Capsule By Mouth Once A Day 11)  Proair Hfa 108 (90 Base) Mcg/act Aers (Albuterol Sulfate) .... As Needed 12)  Tussionex Pennkinetic Er 10-8 Mg/39ml Lqcr (Hydrocod Polst-Chlorphen Polst) .... One Teaspoon Every 12 Hours As Needed For Severe Cough.  Do Not Drive While Taking This Medication  Allergies (verified): No Known Drug Allergies  Past History:  Past Medical History: HYPERTENSION Hyperlipidemia ECZEMA DEPRESSION GERD  ASTHMA Hx of Rheumatic fever-- murmur Hx of right shoulder injury Mitral stenosis and regurgitation  Past Surgical History: Reviewed history from 01/15/2010 and no changes required. Right femur fracture--1983 C section -- 1991 Partial Hysterectomy-- 01/2004 Left breast biopsy (benign)-- 05/2007  Social History: Reviewed history from 01/15/2010 and no changes required. 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 Remaining systems are negative.   Vital Signs:  Patient profile:   50 year old female Menstrual status:  partial hysterectomy Height:  62 inches Weight:      176 pounds BMI:     32.31 Pulse rate:   68 / minute Pulse rhythm:   regular Resp:     18 per minute BP sitting:   120 / 82  (left arm) Cuff size:   large  Vitals Entered By: Vikki Ports (February 12, 2010 11:15  AM)  Physical Exam  General:  Well-developed well-nourished in no acute distress.  Skin is warm and dry.  HEENT is normal.  Neck is supple. No thyromegaly.  Chest is clear to auscultation with normal expansion.  Cardiovascular exam is regular rate and rhythm. 3/6 systolic murmur at the apex. Abdominal exam nontender or distended. No masses palpated. Extremities show no edema. neuro grossly intact    Impression & Recommendations:  Problem # 1:  MITRAL STENOSIS WITH INSUFFICIENCY (ICD-394.2)  Patient has mild to moderate mitral stenosis and moderate mitral regurgitation based on transthoracic echocardiogram. It is rheumatic in etiology. This is the most likely cause of her dyspnea over the past 2 years. She is much improved on diuretics. Continue present medications. Schedule TEE to better assess mitral stenosis and regurgitation. She will most likely require mitral valve replacement. I doubt she would be a good candidate for balloon valvuloplasty given the severity of her mitral regurgitation. If her TEE is consistent with severe valvular disease and mitral valve replacement needed then we will schedule for R and L cardiac catheterization and followup with one of our cardiothoracic surgeons.  Orders: Trans Esophageal Echocardiogram (TEE)  Orders: Trans Esophageal Echocardiogram (TEE)  Problem # 2:  HYPERTENSION (ICD-401.9) Blood pressure control. Continue present medications. Her updated medication list for this problem includes:    Zestril 40 Mg Tabs (Lisinopril) ..... One tablet by mouth once daily    Metoprolol Tartrate 25 Mg Tabs (Metoprolol tartrate) .Marland Kitchen... Take 1 tablet by mouth twice a day    Ecotrin Low Strength 81 Mg Tbec (Aspirin) .Marland Kitchen... Take 1 tablet by mouth once a day.    Furosemide 40 Mg Tabs (Furosemide) ..... One tablet by mouth once daily  Her updated medication list for this problem includes:    Zestril 40 Mg Tabs (Lisinopril) ..... One tablet by mouth once daily     Metoprolol Tartrate 25 Mg Tabs (Metoprolol tartrate) .Marland Kitchen... Take 1 tablet by mouth twice a day    Ecotrin Low Strength 81 Mg Tbec (Aspirin) .Marland Kitchen... Take 1 tablet by mouth once a day.    Furosemide 40 Mg Tabs (Furosemide) ..... One tablet by mouth once daily  Problem # 3:  CHF (ICD-428.0) Euvolemic on examination. Continue present medications. Most likely secondary to mitral valve disease. Her updated medication list for this problem includes:    Zestril 40 Mg Tabs (Lisinopril) ..... One tablet by mouth once daily    Metoprolol Tartrate 25 Mg Tabs (Metoprolol tartrate) .Marland Kitchen... Take 1 tablet by mouth twice a day    Ecotrin Low Strength 81 Mg Tbec (Aspirin) .Marland Kitchen... Take 1 tablet by mouth once a day.    Furosemide 40 Mg Tabs (Furosemide) ..... One tablet by mouth once daily  Problem # 4:  HYPERLIPIDEMIA (ICD-272.4) Continue statin. Check lipids and liver. Her updated medication list for this problem includes:    Zocor 10 Mg Tabs (Simvastatin) .Marland Kitchen... Take 1 tablet by mouth once a dayran out last dose 3 days ago  Her updated medication list for this problem includes:    Zocor 10 Mg Tabs (Simvastatin) .Marland Kitchen... Take 1 tablet by mouth once a  dayran out last dose 3 days ago  Other Orders: T-Hepatic Function [id number]) T-Lipid Profile [id number])  Patient Instructions: 1)  Your physician recommends that you schedule a follow-up appointment in: 6 WEEKS 2)  Your physician has requested that you have a TEE.  During a TEE, sound waves are used to create images of your heart. It provides your doctor with information about the size and shape of your heart and how well your heart's chambers and valves are working. In this test, a transducer is attached to the end of a flexible tube that's guided down your throat and into your esophagus (the tube leading from your mouth to your stomach) to get a more detailed image of your heart. You are not awake for the procedure. Please see the instruction sheet given to you  today.  For further information please visit [email or web address] Prescriptions: ZOCOR 10 MG TABS (SIMVASTATIN) Take 1 tablet by mouth once a dayRAN OUT last dose 3 days ago  #30 x 12   Entered by:   Deliah Goody, RN   Authorized by:   Ferman Hamming, MD, [facility]   Signed by:   Deliah Goody, RN on 02/12/2010   Method used:   Electronically to        [facility] # [phone]* (retail)       [address]       [place], [place]  [address]       Ph: [phone]       Fax: [phone]   RxID:   [id number]

## 2010-04-29 NOTE — Progress Notes (Signed)
  Phone Note Outgoing Call   Call placed by: Lemont Fillers FNP,  January 27, 2010 10:13 AM Call placed to: Patient Summary of Call: Called patient, reviewed CXR results. Will plan referral to pulmonary.   Initial call taken by: Lemont Fillers FNP,  January 27, 2010 10:14 AM

## 2010-04-29 NOTE — Assessment & Plan Note (Signed)
Summary: cough / tf,cma--Rm 5   Vital Signs:  Patient profile:   50 year old female Menstrual status:  partial hysterectomy Height:      62 inches Weight:      179.25 pounds BMI:     32.90 O2 Sat:      99 % on Room air Temp:     97.5 degrees F oral Pulse rate:   71 / minute Pulse rhythm:   regular Resp:     24 per minute BP sitting:   122 / 80  (right arm) Cuff size:   large  Vitals Entered By: Mervin Kung CMA Duncan Dull) (February 14, 2010 1:38 PM)  O2 Flow:  Room air CC: Pt having productive cough with yellow sputum.  Is Patient Diabetic? No Pain Assessment Patient in pain? no      Comments Pt has completed Tussionex. Nicki Guadalajara Fergerson CMA [facility]ull)  February 14, 2010 1:44 PM    Primary Care Provider:  Lemont Fillers FNP  CC:  Pt having productive cough with yellow sputum. Marland Kitchen  History of Present Illness: Ms.  Gradilla is a 50 year old female who presents today with chief complaint of cough.  Sincer her last visit to primary care she has seen Drs Vassie Loll and Jens Som.  She reports that she is scheduled for a TEE on 11/28, which will be followed by cardiac cath and MVR in the end of January.    She notes that initially after she completed antibiotics, her cough and phlegm improved for approximately one week.  Now patient has productive cough,  bringing up "chunky yellow phlegm." Symptoms are   worse at night an associated with insomnia due to cough. Symptoms are no longer associated with rib pain.  Does have some pleuritic pain with breathing.  "Burning in my wind pipe."  Denies wheezing or hemoptysis. Denies associated swelling, SOB,  or fever.  Cough was previously improved with use of Tussionex.  Reports that weight has been stable- wearing double layer of clothing today at weigh in.    Allergies (verified): No Known Drug Allergies  Past History:  Past Medical History: Last updated: 02/12/2010 HYPERTENSION Hyperlipidemia ECZEMA DEPRESSION GERD  ASTHMA Hx of  Rheumatic fever-- murmur Hx of right shoulder injury Mitral stenosis and regurgitation  Past Surgical History: Last updated: 01/15/2010 Right femur fracture--1983 C section -- 1991 Partial Hysterectomy-- 01/2004 Left breast biopsy (benign)-- 05/2007  Family History: Reviewed history from 01/15/2010 and no changes required. Mother--living, renal failure, heart disease, thyroid disease, diabetes, colon polyps  Father--deceased, stroke; heart disease 1 brother--alive and well 1 brother deceased from complications or cellulitis 3 children-- alive and well  [place]- age 22 [name]-23 [name]-20- [name]  Review of Systems       see HPI  Physical Exam  General:  Well-developed,well-nourished,in no acute distress; alert,appropriate and cooperative throughout examination Head:  Normocephalic and atraumatic without obvious abnormalities. No apparent alopecia or balding. Eyes:  PERRLA, sclera intact Mouth:  Oral mucosa and oropharynx without lesions or exudates.  Teeth in good repair. Neck:  No deformities, masses, or tenderness noted. Lungs:  Normal respiratory effort, chest expands symmetrically. Lungs are clear to auscultation, no crackles or wheezes. Heart:  Normal rate and regular rhythm. S1 and S2 normal without gallop, murmur, click, rub or other extra sounds. Extremities:  No edema noted Skin:  Intact without suspicious lesions or rashes Psych:  Cognition and judgment appear intact. Alert and cooperative with normal attention span and concentration. No  apparent delusions, illusions, hallucinations   Impression & Recommendations:  Problem # 1:  BRONCHITIS (ICD-490) Assessment New No wheezing or crackles today.  Pt appears euvolemic today- CHF appears stable.    Will plan rx with zithromax for probable bronchitis.  If symptoms worsen or do not improve consider x-ray.  Continue tussionex at bedtime.  RX was faxed to pharmacy- not given directly to pt due to computer  difficulties.  Patient instructed to follow up in 1-2 weeks, sooner if symtpoms worsen or do not improve. Her updated medication list for this problem includes:    Albuterol Sulfate (2.5 Mg/2ml) 0.083% Nebu (Albuterol sulfate) ..... Use 1 vial in nebulizer every 4 hours as needed.    Proair Hfa 108 (90 Base) Mcg/act Aers (Albuterol sulfate) .Marland Kitchen... As needed    Tussionex Pennkinetic Er 10-8 Mg/27ml Lqcr (Hydrocod polst-chlorphen polst) ..... One teaspoon every 12 hours as needed for severe cough.  do not drive while taking this medication    Zithromax Z-pak 250 Mg Tabs (Azithromycin) .Marland Kitchen... 2 tabs by mouth today, then one tablet by mouth daily for 4 additional days  Problem # 2:  CHF (ICD-428.0) Assessment: Improved Clinically improved,   weight is stable, continue furosemide at current dose, for TEE/Cath per cardiology.   Her updated medication list for this problem includes:    Zestril 40 Mg Tabs (Lisinopril) ..... One tablet by mouth once daily    Metoprolol Tartrate 25 Mg Tabs (Metoprolol tartrate) .Marland Kitchen... Take 1 tablet by mouth twice a day    Ecotrin Low Strength 81 Mg Tbec (Aspirin) .Marland Kitchen... Take 1 tablet by mouth once a day.    Furosemide 40 Mg Tabs (Furosemide) ..... One tablet by mouth once daily  Complete Medication List: 1)  Soma 350 Mg Tabs (Carisoprodol) .... Take 1 tablet every 6 hours. 2)  Prozac 40 Mg Caps (Fluoxetine hcl) .... Take 1 capsule by mouth once a day. 3)  Flonase 50 Mcg/act Susp (Fluticasone propionate) .Marland Kitchen.. 1spray each nostril daily as needed. 4)  Albuterol Sulfate (2.5 Mg/42ml) 0.083% Nebu (Albuterol sulfate) .... Use 1 vial in nebulizer every 4 hours as needed. 5)  Zestril 40 Mg Tabs (Lisinopril) .... One tablet by mouth once daily 6)  Zocor 10 Mg Tabs (Simvastatin) .... Take 1 tablet by mouth once a dayran out last dose 3 days ago 7)  Metoprolol Tartrate 25 Mg Tabs (Metoprolol tartrate) .... Take 1 tablet by mouth twice a day 8)  Ecotrin Low Strength 81 Mg Tbec (Aspirin)  .... Take 1 tablet by mouth once a day. 9)  Furosemide 40 Mg Tabs (Furosemide) .... One tablet by mouth once daily 10)  Prilosec Otc 20 Mg Tbec (Omeprazole magnesium) .... One tablet by mouth daily 11)  Proair Hfa 108 (90 Base) Mcg/act Aers (Albuterol sulfate) .... As needed 12)  Tussionex Pennkinetic Er 10-8 Mg/56ml Lqcr (Hydrocod polst-chlorphen polst) .... One teaspoon every 12 hours as needed for severe cough.  do not drive while taking this medication 13)  Zithromax Z-pak 250 Mg Tabs (Azithromycin) .... 2 tabs by mouth today, then one tablet by mouth daily for 4 additional days Prescriptions: ZITHROMAX Z-PAK 250 MG TABS (AZITHROMYCIN) 2 tabs by mouth today, then one tablet by mouth daily for 4 additional days  #1 pack x 0   Entered and Authorized by:   Lemont Fillers FNP   Signed by:   Lemont Fillers FNP on 02/14/2010   Method used:   Printed then faxed to .Marland KitchenMarland Kitchen  [facility]  [address] # [phone]* (retail)       [phone]       [place], [place]  [address]       Ph: [phone]       Fax: [phone]   RxID:   [id number] Sandria Senter ER 10-8 MG/5ML LQCR (HYDROCOD POLST-CHLORPHEN POLST) one teaspoon every 12 hours as needed for severe cough.  Do not drive while taking this medication  #150 x 0   Entered and Authorized by:   Lemont Fillers FNP   Signed by:   Lemont Fillers FNP on 02/14/2010   Method used:   Printed then faxed to ...       [facility]  [address] # [phone]* (retail)       [phone] [address]       [place], [place]  [address]       Ph: [phone]       Fax: [phone]   RxID:   [id number] Sandria Senter ER 10-8 MG/5ML LQCR (HYDROCOD POLST-CHLORPHEN POLST) one teaspoon every 12 hours as needed for severe cough.  Do not drive while taking this medication  #150 x 0   Entered and Authorized by:   Lemont Fillers FNP   Signed by:   Lemont Fillers FNP on 02/14/2010   Method used:    Print then Give to Patient   RxID:   [id number] ZITHROMAX Z-PAK 250 MG TABS (AZITHROMYCIN) 2 tabs by mouth today, then one tablet by mouth daily for 4 additional days  #1 pack x 0   Entered and Authorized by:   Lemont Fillers FNP   Signed by:   Lemont Fillers FNP on 02/14/2010   Method used:   Electronically to        [facility] # [phone]* (retail)       [address]       [place], [place]  [address]       Ph: [phone]       Fax: [phone]   RxID:   [id number]    Orders Added: 1)  New Patient Level IV [37169]    Current Allergies (reviewed today): No known allergies

## 2010-04-29 NOTE — Assessment & Plan Note (Signed)
Summary: PER Julia Garza/HX OF ASTHMA/PNUEMONIA/[facility]   Visit Type:  Initial Consult Primary [name]/Referring [name]:  Lemont Fillers FNP  CC:  Pulmonary consult.  History of Present Illness: 50/F, never smoker for evaluation of 'asthma'. She reports a diagnosis of  asthma x 3 yrs, no childhood history, no clear triggers or exacerbations until recently. She just moved to [facility] from [facility], was hospitalised prior to move for 'pneumonia' & heart failure - BNP 2726, WC nml. - treated with levaquin & lasix. CT chest with contrast - no PE or edema. Reviewed serial CXRs , 10/7 shows bihilar infiltrates which seem to improve on FU imaging, cardiomegaly with straightening of lt heart border & prominent pulmonary arteries c/w mitral stenosis. She has been seen by [name], echo has shown mod MS/ MR - plan awaited. She has improved with lasix, ACE inhibitor.Denies orthopnea, PND or pedal edema. She is now off advair (too expensive), has not needed pro-air or albuterol nebs ina few weeks. Tussionex has caused a decrease in cough. Spirometry showed decreased FEv1 at 69%, but ratio was nml suggesting that this is due to restriction rather than obstruction. FVC was mildly decreased.  Preventive Screening-Counseling & Management  Alcohol-Tobacco     Alcohol drinks/day: 0     Smoking Status: never  Allergies: No Known Drug Allergies  Past History:  Past Medical History: Last updated: 01/22/2010 HYPERTENSION Hyperlipidemia ECZEMA DEPRESSION GERD  ASTHMA Hx of Rheumatic fever-- murmur Hx of right shoulder injury  Family History: Last updated: 01/15/2010 Mother--living, renal failure, heart disease, thyroid disease, diabetes, colon polyps  Father--deceased, stroke; heart disease 1 brother--alive and well 1 brother deceased from complications or cellulitis 3 children-- alive and well  [place]- age 72 Julia Garza-23 Julia Garza-20- [name]  Social History: Last updated:  01/15/2010 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  The patient denies shortness of breath with activity, shortness of breath at rest, coughing up blood, chest pain, irregular heartbeats, loss of appetite, abdominal pain, difficulty swallowing, sore throat, tooth/dental problems, itching, anxiety, hand/feet swelling, rash, and fever.    Vital Signs:  Patient profile:   50 year old female Menstrual status:  partial hysterectomy Height:      62 inches Weight:      177 pounds BMI:     32.49 O2 Sat:      99 % on Room air Temp:     98.1 degrees F oral Pulse rate:   71 / minute BP sitting:   118 / 72  (left arm) Cuff size:   large  Vitals Entered By: Zackery Barefoot CMA (February 11, 2010 11:01 AM)  O2 Flow:  Room air CC: Pulmonary consult Comments Medications reviewed with patient Verified contact number and pharmacy with patient Zackery Barefoot CMA  February 11, 2010 11:03 AM    Physical Exam  Additional Exam:  Gen. Pleasant, well-nourished, in no distress, normal affect ENT - no lesions, no post nasal drip Neck: No JVD, no thyromegaly, no carotid bruits Lungs: no use of accessory muscles, no dullness to percussion, clear without rales or rhonchi  Cardiovascular: Rhythm regular, loud P2, diastolic murmur +, no peripheral edema Abdomen: soft and non-tender,  no hepatosplenomegaly, BS normal. Musculoskeletal: No deformities, no cyanosis or clubbing Neuro:  alert, non focal     Impression & Recommendations:  Problem # 1:  ASTHMA, WITH ACUTE EXACERBATION (ICD-493.92) OK to stay off advair Use pro-air as  needed only for wheezing Breathing test shows restriction Await plan for mitral regurg/ MS  Other Orders: Consultation Level III (16109) Spirometry w/Graph (60454)  Patient Instructions: 1)  Copy sent to:Dr crenshaw, Julia 2)  Please schedule a follow-up appointment in 6 weeks. 3)  OK to stay off advair 4)  Use pro-air as needed only for wheezing 5)  Breathing test shows restriction

## 2010-04-29 NOTE — Progress Notes (Signed)
Summary: Records Rec'd [facility]  Phone Note Other Incoming   Summary of Call: Received records from [facility]. Mervin Kung CMA Duncan Dull)  March 04, 2010 11:54 AM

## 2010-04-29 NOTE — Assessment & Plan Note (Signed)
Summary: new [facility] [employer] [place]--Rm 6   Vital Signs:  Patient profile:   50 year old female Menstrual status:  partial hysterectomy LMP:     01/29/2004 Height:      62 inches Weight:      177 pounds BMI:     32.49 O2 Sat:      92 % on Room air Temp:     97.9 degrees F oral Pulse rate:   81 / minute Pulse rhythm:   regular Resp:     20 per minute BP sitting:   130 / 88  (left arm) Cuff size:   large  Vitals Entered By: Mervin Kung CMA Duncan Dull) (January 15, 2010 9:59 AM)  O2 Flow:  Room air CC: Rm 6  New pt.  Hospital f/u, recently diagnosed with pneumonia and CHF Is Patient Diabetic? No Pain Assessment Patient in pain? no      LMP (date): 01/29/2004     Menstrual Status partial hysterectomy Enter LMP: 01/29/2004 Last PAP Result normal   Primary Care Provider:  Lemont Fillers FNP  CC:  Rm 6  New pt.  Hospital f/u and recently diagnosed with pneumonia and CHF.  History of Present Illness: Julia Garza is a 50 year old female who presents today to establish care.  Her chief complaint today is cough.   She moved here last week from [place].  The patient was admitted to [facility] 10/8- 10/11- diagnosed with pneumonia and an issue with her heart valve which was leading to volume overload.  She tells me that she underwent a complete workup and was told that she will need referral for valve surgery.  She is scheduled to see Dr. Jens Som next week to establish for cardiology.   She was treated with IV antibiotics in the hospital and was sent home with an rx for Levaquin which she completed about 4 days ago.   She was started on lopressor, ACE inhibitor, and aspirin therapy during this admission.  She was seen downstairs in the ED on Monday due to pleuritic chest pain with cough.  She was given hydrocodone for cough. Denies swelling, or shortness of breath.  Has been using her nebulizer.    Asthma- notes that she initially went  to the hospital because she believed that her symptoms were due to an acute asthma exacerbation.  + wheezing, using nebs.   Depression- has tried prozac and lexapro (too expensive).  Tried generic zoloft didn't help.  Now on Prozac.  Feels that this is helping.    Preventive Screening-Counseling & Management  Alcohol-Tobacco     Alcohol drinks/day: 0     Smoking Status: never  Caffeine-Diet-Exercise     Caffeine use/day: 4 drinks daily coffee/tea     Does Patient Exercise: yes     Type of exercise: walking     Times/week: 5      Drug Use:  no.    Allergies (verified): No Known Drug Allergies  Past History:  Past Medical History: Asthma Pneumonia CHF Hx of Rheumatic fever-- murmur HTN GERD Depression/Anxiety Hx of right shoulder injury Eczema  Past Surgical History: Right femur fracture--1983 C section -- 1991 Partial Hysterectomy-- 01/2004 Left breast biopsy (benign)-- 05/2007  Family History: Mother--living, renal failure, heart disease, thyroid disease, diabetes, colon polyps  Father--deceased, stroke; heart disease 1 brother--alive and well 1 brother deceased from complications or cellulitis 3 children-- alive and well  [place]- age 88 [name]-23 [name]-20- Goes  to [employer]  Social History: 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  CV:  + pleuritic pain with cough GI:  Denies nausea or vomitting or diarrhea GU: Denies dysuria Lymphatic: Denies lymphadenopathy Musculoskeletal:  chronic right shoulder pain- has had work up and told inflammation Skin:   Eczema Psychiatric: + history of depression and anxiety.   Neuro: Denies numbness     Physical Exam  General:  Well-developed,well-nourished,in no acute distress; alert,appropriate and cooperative throughout examination Head:  Normocephalic and atraumatic without obvious abnormalities. No apparent alopecia or balding. Eyes:  No corneal or conjunctival inflammation noted. Perrla. Vision grossly normal. Ears:  External ear exam shows no significant lesions or deformities.  Otoscopic examination reveals clear canals, tympanic membranes are intact bilaterally without bulging, retraction, inflammation or discharge. Hearing is grossly normal bilaterally. Neck:  No deformities, masses, or tenderness noted. No thyroid enlargment Lungs:  Normal respiratory effort, chest expands symmetrically. + bilateral wheeze, coarse breath sounds with scattered crackles. Heart:  Normal rate and regular rhythm. S1 and S2 normal without gallop, murmur, click, rub or other extra sounds. Abdomen:  Bowel sounds positive,abdomen soft and non-tender without masses, organomegaly or hernias noted. Msk:  + musculoskeletal pain overlying rib cage due to cough Neurologic:  alert & oriented X3 and cranial nerves II-XII intact.   Skin:  Intact without suspicious lesions or rashes Cervical Nodes:  No lymphadenopathy noted Psych:  Cognition and judgment appear intact. Alert and cooperative with normal attention span and concentration. No apparent delusions, illusions, hallucinationsOriented X3, memory intact for recent and remote, and normally interactive.     Impression & Recommendations:  Problem # 1:  ASTHMA, WITH ACUTE EXACERBATION (ICD-493.92) Assessment Deteriorated Pt given IM solumedrol here in the office.  Will follow with a prednisone taper, continue scheduled nebs Q 6 hours Her updated medication list for this problem includes:    Albuterol Sulfate (2.5 Mg/56ml) 0.083% Nebu (Albuterol sulfate) ..... Use 1 vial in  nebulizer every 4 hours as needed.    Advair Hfa 115-21 Mcg/act Aero (Fluticasone-salmeterol) .Marland Kitchen... 2 puffs two times a day.    Prednisone 10 Mg Tabs (Prednisone) .Marland Kitchen... Take 4 tablets daily x 2 days, then 3 tabs daily x 2 days, then 2 tabs daily x 2 days, then 1 tab daily for 2 days then stop.  Orders: Solumedrol up to 125mg  (Z6109) Admin of Therapeutic Inj  intramuscular or subcutaneous ([address]) Albuterol Sulfate Sol 1mg  unit dose (U9811) Nebulizer Tx (91478)  Problem # 2:  PNEUMONIA, BILATERAL (ICD-486) Assessment: Comment Only Bilateral perihilar PNA on Monday in the ED.  Records of previous CXR currently unavailable.  Was previously on Levaquin.  Will treat with ceftin and zithromax. Her updated medication list for this problem includes:    Ceftin 500 Mg Tabs (Cefuroxime axetil) ..... One tablet by mouth two times a day x 7 days    Zithromax Z-pak 250 Mg Tabs (Azithromycin) .Marland Kitchen... 2 tablet by mouth today, then one tablet by mouth daily for 4 more days  Problem # 3:  CHF (ICD-428.0) Assessment: Deteriorated Wet on exam, will initiate furosemide.  Patient's BNP on monday was 610.  Pt's wheezing and cough is likely multifactorial: pneumonia, acute asthma exacerbation, and volume overload.  Plan close follow up in 2 days. Her updated medication list for this problem includes:    Zestril 20 Mg Tabs (Lisinopril) .Marland Kitchen... Take 1 tablet by mouth once a day    Metoprolol Tartrate 25 Mg Tabs (Metoprolol tartrate) .Marland Kitchen... Take 1 tablet by mouth once a day.    Ecotrin Low Strength 81 Mg Tbec (Aspirin) .Marland Kitchen... Take 1 tablet by mouth once a day.    Furosemide 40 Mg Tabs (Furosemide) ..... One tablet by mouth once daily  Complete Medication List: 1)  Soma 350 Mg Tabs (Carisoprodol) .... Take 1 tablet every 6 hours. 2)  Prozac 40 Mg Caps (Fluoxetine hcl) .... Take 1 capsule by mouth once a day. 3)  Estradiol 1 Mg Tabs (Estradiol) .... Take 1 tablet by mouth once a day. 4)  Flonase 50 Mcg/act Susp  (Fluticasone propionate) .Marland Kitchen.. 1spray each nostril daily as needed. 5)  Albuterol Sulfate (2.5 Mg/68ml) 0.083% Nebu (Albuterol sulfate) .... Use 1 vial in nebulizer every 4 hours as needed. 6)  Advair Hfa 115-21 Mcg/act Aero (Fluticasone-salmeterol) .... 2 puffs two times a day. 7)  Hydrocodone-acetaminophen 7.5-500 Mg Tabs (Hydrocodone-acetaminophen) .Marland Kitchen.. 1 tablet every 4 -6 hours as needed pain. 8)  Zestril 20 Mg Tabs (Lisinopril) .... Take 1 tablet by mouth once a day 9)  Zocor 10 Mg Tabs (Simvastatin) .... Take 1 tablet by mouth once a day. 10)  Metoprolol Tartrate 25 Mg Tabs (Metoprolol tartrate) .... Take 1 tablet by mouth once a day. 11)  Ecotrin Low Strength 81 Mg Tbec (Aspirin) .... Take 1 tablet by mouth once a day. 12)  Mucinex Dm Maximum Strength 60-1200 Mg Xr12h-tab (Dextromethorphan-guaifenesin) .... Take 1 tablet by mouth two times a day. 13)  Ceftin 500 Mg Tabs (Cefuroxime axetil) .... One tablet by mouth two times a day x 7 days 14)  Zithromax Z-pak 250 Mg Tabs (Azithromycin) .... 2 tablet by mouth today, then one tablet by mouth daily for 4 more days 15)  Prednisone 10 Mg Tabs (Prednisone) .... Take 4 tablets daily x 2 days, then 3 tabs daily x 2 days, then 2 tabs daily x 2 days, then 1 tab daily for 2 days then stop. 16)  Furosemide 40 Mg Tabs (Furosemide) .... One tablet by mouth once daily  Patient Instructions: 1)  Call if you develop fever over 101, worsening shortness of breath. 2)  Go to ED if severe. 3)  Check your weight daily, call if you gain more than 3 pounds overnight or if you develop swelling. 4)  Follow up on Friday of this week. Prescriptions: FUROSEMIDE 40 MG TABS (FUROSEMIDE) one tablet by mouth once daily  #30 x 0   Entered and Authorized by:   Lemont Fillers FNP   Signed by:   Lemont Fillers FNP on 01/15/2010   Method used:   Electronically to        [facility] # [phone]* (retail)       [address] [place], [place]  [address]       Ph: [phone]       Fax: [phone]   RxID:   [id number] PREDNISONE 10 MG TABS (PREDNISONE) take 4 tablets daily x 2 days, then 3 tabs daily x 2 days, then 2 tabs daily  x 2 days, then 1 tab daily for 2 days then stop.  #20 x 0   Entered and Authorized by:   Lemont Fillers FNP   Signed by:   Lemont Fillers FNP on 01/15/2010   Method used:   Electronically to        [facility] # [phone]* (retail)       [address] [place], [place]  [address]       Ph: [phone]       Fax: [phone]   RxID:   [id number] ZITHROMAX Z-PAK 250 MG TABS (AZITHROMYCIN) 2 tablet by mouth today, then one tablet by mouth daily for 4 more days  #1 pack x 0   Entered and Authorized by:   Lemont Fillers FNP   Signed by:   Lemont Fillers FNP on 01/15/2010   Method used:   Electronically to        [facility] # [phone]* (retail)       [address] [place], [place]  [address]       Ph: [phone]       Fax: [phone]   RxID:   [id number] CEFTIN 500 MG TABS (CEFUROXIME AXETIL) one tablet by mouth two times a day x 7 days  #14 x 0   Entered and Authorized by:   Lemont Fillers FNP   Signed by:   Lemont Fillers FNP on 01/15/2010   Method used:   Electronically to        [facility] # [phone]* (retail)       [address] [place], [place]  [address]       Ph: [phone]       Fax: [phone]   RxID:   [id number]    Medication Administration  Injection # 1:    Medication: Solumedrol up to 125mg     Diagnosis: ASTHMA, WITH ACUTE EXACERBATION [phone])    Route: IM    Site: RUOQ gluteus    Exp Date: 07/27/2012    Lot #: [id number]    Mfr: [employer]    Patient tolerated injection without complications    Given by: Mervin Kung CMA Duncan Dull) (January 15, 2010 11:24 AM)  Medication # 1:    Medication: Albuterol Sulfate  Sol 1mg  unit dose    Diagnosis: ASTHMA, WITH ACUTE EXACERBATION (ICD-493.92)    Dose: 2.5mg  / 3 mL    Route: inhaled    Exp Date: 09/27/2010    Lot #: [id number]    Mfr: [employer]    Patient tolerated medication without complications    Given by: Mervin Kung CMA Duncan Dull) (January 15, 2010 11:25 AM)  Orders Added: 1)  Solumedrol up to 125mg  [J2930] 2)  Admin of Therapeutic Inj  intramuscular or subcutaneous [96372] 3)  Albuterol Sulfate Sol 1mg  unit dose [J7613] 4)  Nebulizer Tx [94640] 5)  New Patient Level IV [[address]]   Immunization History:  Influenza Immunization History:    Influenza:  historical (01/07/2010)  Pneumovax Immunization History:    Pneumovax:  historical (01/07/2010)  Tetanus/Td Immunization History:    Tetanus/Td:  historical (10/31/2007)   Immunization History:  Influenza Immunization History:    Influenza:  Historical (  01/07/2010)  Pneumovax Immunization History:    Pneumovax:  Historical (01/07/2010)  Tetanus/Td Immunization History:    Tetanus/Td:  Historical (10/31/2007)     Preventive Care Screening  Bone Density:    Date:  10/29/2008    Results:  osteopenia std dev  Mammogram:    Date:  10/29/2008    Results:  normal   Pap Smear:    Date:  10/29/2008    Results:  normal   Last Tetanus Booster:    Date:  10/31/2007    Results:  Historical    Current Allergies (reviewed today): No known allergies  Appended Document: new to [facility] [employer] [place] from [place] state--Rm 6 records from hospitalization have been requested.

## 2010-04-30 ENCOUNTER — Ambulatory Visit (INDEPENDENT_AMBULATORY_CARE_PROVIDER_SITE_OTHER): Payer: Medicare FFS | Admitting: Cardiology

## 2010-04-30 ENCOUNTER — Encounter: Payer: Self-pay | Admitting: Cardiology

## 2010-04-30 ENCOUNTER — Telehealth: Payer: Self-pay | Admitting: Family

## 2010-04-30 DIAGNOSIS — I059 Rheumatic mitral valve disease, unspecified: Secondary | ICD-10-CM

## 2010-04-30 DIAGNOSIS — I10 Essential (primary) hypertension: Secondary | ICD-10-CM

## 2010-05-01 LAB — CONVERTED CEMR LAB
BUN: 26 mg/dL — ABNORMAL HIGH (ref 6–23)
Glucose, Bld: 86 mg/dL (ref 70–99)
Potassium: 4.5 meq/L (ref 3.5–5.3)
Pro B Natriuretic peptide (BNP): 50.3 pg/mL (ref 0.0–100.0)

## 2010-05-01 NOTE — Progress Notes (Signed)
Summary: Status Update  Phone Note Call from Patient Call back at Home Phone [phone]   Caller: Patient Call For: Lemont Fillers FNP Summary of Call: patient called and left voice message stating she had a fever about 3 hours ago. She took Tylenol with relief. Her message states she is still bothered by the cough. It is causing rib pain, and she states the rx for the pearls are not providing any relief. She would like to know if she could get a rx for Tussinex for her cough Initial call taken by: Glendell Docker CMA,  March 19, 2010 3:22 PM  Follow-up for Phone Call        I suggest pt start abx - see rx.   needs OV within 24-48 hrs if persistent fever and cough otherwise f/u with Melissa in 1 week Follow-up by: D. Thomos Lemons DO,  March 20, 2010 11:47 AM  Additional Follow-up for Phone Call Additional follow up Details #1::        Pt notified. Nicki Guadalajara Fergerson CMA (AAMA)  March 20, 2010 1:24 PM     New/Updated Medications: CEFUROXIME AXETIL 500 MG TABS (CEFUROXIME AXETIL) one by mouth two times a day Prescriptions: CEFUROXIME AXETIL 500 MG TABS (CEFUROXIME AXETIL) one by mouth two times a day  #20 x 0   Entered and Authorized by:   D. Thomos Lemons DO   Signed by:   D. Thomos Lemons DO on 03/20/2010   Method used:   Electronically to        [facility] # [phone]* (retail)       [address]       [place], [place]  [address]       Ph: [phone]       Fax: [phone]   RxID:   [id number]

## 2010-05-01 NOTE — Progress Notes (Signed)
Summary: nos appt  Phone Note Call from Patient   Caller: [email or web address]  Call For: alva Summary of Call: LMTCB x2 to rsc nos from 12/20. Initial call taken by: Darletta Moll,  March 19, 2010 3:13 PM

## 2010-05-01 NOTE — Assessment & Plan Note (Signed)
Summary: [facility]   Visit Type:  Post-hospital Primary Provider:  Lemont Fillers FNP  CC:  No complaints.  History of Present Illness: 50 year old female for f/u of MS/MR. An echocardiogram was performed in November of 2011 and revealed normal LV function, moderate left atrial enlargement, mild right atrial enlargement, moderate mitral regurgitation and mild to moderate mitral stenosis; mild AI. TEE in 11/11 revealed normal LV function and mild MS (valve area 2.4 cm) and mild AI/MR. Hospitalized in December with worsening heart failure symptoms. BNP elevated at 449. D-dimer mildly elevated but CTA showed no pulmonary embolus. There were pulmonary infiltrates felt most likely secondary to pulmonary edema. She improved with diuresis. Cardiac catheterization was performedin December of 2011 and normal LV function, normal coronary arteries, moderate muscular regurgitation and mild mitral stenosis with a valve area of 1.85 cm. She was discharged on diuretics. Note a sedimentation rate was 62. Since she was discharged, she denies dyspnea on exertion, orthopnea, PND, pedal edema, chest pain or syncope. She continues to have cough productive of a whitish sputum. No fevers or chills or hemoptysis. No arthralgias.  Current Medications (verified): 1)  Soma 350 Mg Tabs (Carisoprodol) .... Take 1 Tablet Every 6 Hours. 2)  Prozac 40 Mg Caps (Fluoxetine Hcl) .... Take 1 Capsule By Mouth Once A Day. 3)  Flonase 50 Mcg/act Susp (Fluticasone Propionate) .Marland Kitchen.. 1spray Each Nostril Daily As Needed. 4)  Zestril 40 Mg Tabs (Lisinopril) .... One Tablet By Mouth Once Daily 5)  Metoprolol Tartrate 25 Mg Tabs (Metoprolol Tartrate) .... Take 1 Tablet By Mouth Twice A Day 6)  Ecotrin Low Strength 81 Mg Tbec (Aspirin) .... Take 1 Tablet By Mouth Once A Day. 7)  Furosemide 40 Mg Tabs (Furosemide) .... One Tablet By Mouth Once Daily 8)  Prilosec Otc 20 Mg Tbec (Omeprazole Magnesium) .... One Tablet By Mouth  Daily 9)  Proair Hfa 108 (90 Base) Mcg/act Aers (Albuterol Sulfate) .... As Needed 10)  Tessalon Perles 100 Mg Caps (Benzonatate) .... One Cap By Mouth Three Times A Day As Needed For Cough 11)  Cefuroxime Axetil 500 Mg Tabs (Cefuroxime Axetil) .... One By Mouth Two Times A Day  Allergies (verified): No Known Drug Allergies  Past History:  Past Medical History: Reviewed history from 02/12/2010 and no changes required. HYPERTENSION Hyperlipidemia ECZEMA DEPRESSION GERD  ASTHMA Hx of Rheumatic fever-- murmur Hx of right shoulder injury Mitral stenosis and regurgitation  Past Surgical History: Reviewed history from 01/15/2010 and no changes required. Right femur fracture--1983 C section -- 1991 Partial Hysterectomy-- 01/2004 Left breast biopsy (benign)-- 05/2007  Social History: Reviewed history from 01/15/2010 and no changes required. 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 Remaining systems are negative.   Vital Signs:  Patient profile:   50 year old female Menstrual status:  partial hysterectomy Height:      62 inches Weight:      179.50 pounds BMI:     32.95 Pulse rate:   63 / minute Pulse rhythm:   regular Resp:     18 per minute BP sitting:   100 / 68  (left arm) Cuff size:   large  Vitals Entered By: Vikki Ports (April 09, 2010 11:18 AM)  Physical Exam  General:  Well-developed well-nourished in no acute distress.  Skin is warm and dry.  HEENT is normal.  Neck is supple. No thyromegaly.  Chest is clear to auscultation with normal expansion.  Cardiovascular exam is regular rate and rhythm. 2/6 systolic murmur at the apex. Abdominal exam nontender or  distended. No masses palpated. Extremities show no edema. neuro grossly intact    Impression & Recommendations:  Problem # 1:  MITRAL STENOSIS WITH INSUFFICIENCY (ICD-394.2) Patient is symptomatically improved. We will continue with Lasix 40 mg daily and an additional 40 mg as needed. I have reviewed her transthoracic and transesophageal echoes and her cardiac catheterization results. 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 However it does not appear to be severe enough to be causing CHF. However she has had presentations consistent with CHF. I wonder if her blood pressure elevation previously caused worsening mitral regurgitation. It is now well-controlled. If she has recurrent problems with CHF then I will consider referring to [facility]: [facility] Rhonda W for further evaluation of her mitral valve. She will most likely need mitral valve replacement in the future but I'm not convinced it is required now. Repeat potassium and renal function as well as BNP. Note her sedimentation rate was also elevated during recent hospitalization. I will repeat this. I do not think a rheumatologic problem is contributing to her pulmonary infiltrates but should be kept in mind. Repeat chest x-ray. We are awaiting outside records from [place].  Problem # 2:  HYPERLIPIDEMIA (ICD-272.4) Management per primary care. She did have arthralgias on Zocor. The following medications were removed from the medication list:    Zocor 10 Mg Tabs (Simvastatin) .Marland Kitchen... Take 1 tablet by mouth once a dayran out last dose 3 days ago  Problem # 3:  HYPERTENSION (ICD-401.9) Blood pressure controlled on present medications. Her updated medication list for this problem includes:    Zestril 40 Mg Tabs (Lisinopril) ..... One tablet by mouth once daily    Metoprolol Tartrate 25 Mg Tabs (Metoprolol tartrate) .Marland Kitchen... Take 1 tablet by mouth twice a day    Ecotrin Low Strength 81 Mg Tbec (Aspirin) .Marland Kitchen... Take 1 tablet by mouth once a day.     Furosemide 40 Mg Tabs (Furosemide) ..... One tablet by mouth once daily  Orders: T-Basic Metabolic Panel [phone])  Problem # 4:  GERD (ICD-530.81)  Her updated medication list for this problem includes:    Prilosec Otc 20 Mg Tbec (Omeprazole magnesium) ..... One tablet by mouth daily  Other Orders: T-2 View CXR (71020TC) T-BNP  (B Natriuretic Peptide) (09811-91478) T- Sed rate non-auto (29562)  Patient Instructions: 1)  Your physician recommends that you schedule a follow-up appointment in: 8 weeks  Appended Document: [facility] Electrocardiogram shows sinus rhythm with first degree AV block.

## 2010-05-01 NOTE — Assessment & Plan Note (Signed)
Summary: OV   per Julia Garza   Julia Garza rsc per note from dr Artist Garza   Vital Signs:  Patient profile:   50 year old female Menstrual status:  partial hysterectomy Height:      62 inches Weight:      183.50 pounds BMI:     33.68 O2 Sat:      98 % on Room air Temp:     97.4 degrees F oral Pulse rate:   82 / minute Resp:     18 per minute BP sitting:   146 / 100  (right arm) Cuff size:   large  Vitals Entered By: Julia Garza CMA (March 25, 2010 8:12 AM)  O2 Flow:  Room air  Contraindications/Deferment of Procedures/Staging:    Test/Procedure: PAP Smear    Reason for deferment: hysterectomy  CC: follow-up visit Is Patient Diabetic? No Pain Assessment Patient in pain? no      Comments productive cough yellow in color, and increase in shortness in breath with activity, and heaviness in chest, wanted to make Julia Garza aware she had a biopsy of left breast in Feb 2010 that was benign     Last PAP Result Declined   Primary Care Provider:  Lemont Fillers FNP  CC:  follow-up visit.  History of Present Illness: Julia Garza is a 50 year old female with history of mitral stenosis and recent volume overload who presents today for follow up.  She presents with chief complaint of shortness of breath.  She was initally seen on 12/19 and was noted to be clinically volume overloaded.  Pulmonary edema was confirmed on CXR at that time.  Her BNP was 518.   The patient's Furosemide was increased from 40mg  PO once daily to BID.  She has dropped 1.5 pounds on this regimen.   On 12/20 she developed a fever of 102.  She called the office and an rx for ceftin was called in by Dr.  Artist Garza.  Since that time she has had no further fevers.  She continues to have a productive cough.  She is unable to lay flat due to shortness of breath.  She notes continued + DOE, and feels that this has worsened since last visit.  She reports that she even feels short of breath with conversation.  Symptoms are associated with  chest heaviness.  She has prior history of CHF exacerbation  and pneumonia.  She is followed by Julia Garza of [facility] cardiology and is being considered for MVR.  Her next appointment is on 1/11 with Julia Garza.  Preventive Screening-Counseling & Management  Alcohol-Tobacco     Smoking Status: never  Allergies (verified): No Known Drug Allergies  Past History:  Past Medical History: Last updated: 02/12/2010 HYPERTENSION Hyperlipidemia ECZEMA DEPRESSION GERD  ASTHMA Hx of Rheumatic fever-- murmur Hx of right shoulder injury Mitral stenosis and regurgitation  Past Surgical History: Last updated: 01/15/2010 Right femur fracture--1983 C section -- 1991 Partial Hysterectomy-- 01/2004 Left breast biopsy (benign)-- 05/2007  Family History: Reviewed history from 01/15/2010 and no changes required. Mother--living, renal failure, heart disease, thyroid disease, diabetes, colon polyps  Father--deceased, stroke; heart disease 1 brother--alive and well 1 brother deceased from complications or cellulitis 3 children-- alive and well  Julia Garza- age 51 Julia Garza-23 Julia Garza-20- [name]  Social History: Reviewed history from 01/15/2010 and no changes required. 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 Resp: + SOB, + DOE CV:  Denies Chest Pain,  +chest heaviness GI:  Denies nausea or vomitting GU: Denies dysuria Lymphatic: Denies lymphadenopathy Musculoskeletal:  chronic joint pain Skin:  Denies Rashes Psychiatric: Denies depression Neuro: Denies numbness     Physical Exam  General:  Well-developed,well-nourished,in no acute distress; alert,appropriate and cooperative throughout examination Head:  Normocephalic and atraumatic without obvious abnormalities. No apparent  alopecia or balding. Mouth:  fair dentition.   Lungs:  Bibasilar crackles, appears slightly winded with conversation.   Heart:  Normal rate and regular rhythm. + grade 2-3/6 systolic murmur Abdomen:  Bowel sounds positive,abdomen soft and non-tender without masses, organomegaly or hernias noted. Extremities:  No LE edema is noted Skin:  Intact without suspicious lesions or rashes Psych:  Cognition and judgment appear intact. Alert and cooperative with normal attention span and concentration. No apparent delusions, illusions, hallucinations   Impression & Recommendations:  Problem # 1:  CHF (ICD-428.0) Assessment Deteriorated Pt with increasing shortness of breath.  Her baseline weight is about 177, today she is up to 183.5.  She has made minimal improvement with oral diuresis with lasix.  I suspect that she also developed an overlay of pneumonia which has been helped with the Ceftin.   At this time,  she would benefit from some IV diuresis and admission.  She continues to decompensate from a cardiac standpoint which I suspect is due to her mitral valve disease.  Pt drove herself here today.  Will send down to the ED for evaluation and probable admission.  She will need cardiology consultation from [facility] assuming that she is admitted to [facility].   Report has been provided to the charge nurse, Julia Garza at the [facility] ED.   30 minutes were spent with [name] today and with  the coordination of her care.    Her updated medication list for this problem includes:    Zestril 40 Mg Tabs (Lisinopril) ..... One tablet by mouth once daily    Metoprolol Tartrate 25 Mg Tabs (Metoprolol tartrate) .Marland Kitchen... Take 1 tablet by mouth twice a day    Ecotrin Low Strength 81 Mg Tbec (Aspirin) .Marland Kitchen... Take 1 tablet by mouth once a day.    Furosemide 40 Mg Tabs (Furosemide) ..... One tablet by mouth once daily  Complete Medication List: 1)  Soma 350 Mg Tabs (Carisoprodol) .... Take 1 tablet every 6 hours. 2)  Prozac 40  Mg Caps (Fluoxetine hcl) .... Take 1 capsule by mouth once a day. 3)  Flonase 50 Mcg/act Susp (Fluticasone propionate) .Marland Kitchen.. 1spray each nostril daily as needed. 4)  Albuterol Sulfate (2.5 Mg/9ml) 0.083% Nebu (Albuterol sulfate) .... Use 1 vial in nebulizer every 4 hours as needed. 5)  Zestril 40 Mg Tabs (Lisinopril) .... One tablet by mouth once daily 6)  Zocor 10 Mg Tabs (Simvastatin) .... Take 1 tablet by mouth once a dayran out last dose 3 days ago 7)  Metoprolol Tartrate 25 Mg Tabs (Metoprolol tartrate) .... Take 1 tablet by mouth twice a day 8)  Ecotrin Low Strength 81 Mg Tbec (Aspirin) .... Take 1 tablet by mouth once a day. 9)  Furosemide 40 Mg Tabs (Furosemide) .... One tablet by mouth once daily 10)  Prilosec Otc 20 Mg Tbec (Omeprazole magnesium) .... One tablet by mouth daily 11)  Proair Hfa 108 (90 Base) Mcg/act Aers (Albuterol sulfate) .... As needed  12)  Tessalon Perles 100 Mg Caps (Benzonatate) .... One cap by mouth three times a day as needed for cough 13)  Cefuroxime Axetil 500 Mg Tabs (Cefuroxime axetil) .... One by mouth two times a day  Patient Instructions: 1)  Please go  directly downstairs to the ED.  They are expecting you.     Orders Added: 1)  Est. Patient Level IV [[address]]    Current Allergies (reviewed today): No known allergies     Preventive Care Screening  Pap Smear:    Date:  03/25/2010    Results:  Declined

## 2010-05-01 NOTE — Letter (Signed)
Summary: Custom - Lipid  [facility], Main Office  [address]   [place], [place] [address]   Phone: [phone]  Fax: [phone]     March 12, 2010 MRN: [id number]   [facility] [address] [place], [place]  [address]   Dear Ms. Chea,  We have reviewed your cholesterol results.  They are as follows:     Total Cholesterol:    177 (Desirable: less than 200)       HDL  Cholesterol:     52  (Desirable: greater than 40 for men and 50 for women)       LDL Cholesterol:       109  (Desirable: less than 100 for low risk and less than 70 for moderate to high risk)       Triglycerides:       79  (Desirable: less than 150)  Our recommendations include:These numbers look good. Continue on the same medicine. Liver function is normal. Take care, Dr. Darel Hong.    Call our office at the number listed above if you have any questions.  Lowering your LDL cholesterol is important, but it is only one of a large number of "risk factors" that may indicate that you are at risk for heart disease, stroke or other complications of hardening of the arteries.  Other risk factors include:   A.  Cigarette Smoking* B.  High Blood Pressure* C.  Obesity* D.   Low HDL Cholesterol (see yours above)* E.   Diabetes Mellitus (higher risk if your is uncontrolled) F.  Family history of premature heart disease G.  Previous history of stroke or cardiovascular disease    *These are risk factors YOU HAVE CONTROL OVER.  For more information, visit .  There is now evidence that lowering the TOTAL CHOLESTEROL AND LDL CHOLESTEROL can reduce the risk of heart disease.  The American Heart Association recommends the following guidelines for the treatment of elevated cholesterol:  1.  If there is now current heart disease and less than two risk factors, TOTAL CHOLESTEROL should be less than 200 and LDL CHOLESTEROL should be less than 100. 2.  If there is current heart disease or  two or more risk factors, TOTAL CHOLESTEROL should be less than 200 and LDL CHOLESTEROL should be less than 70.  A diet low in cholesterol, saturated fat, and calories is the cornerstone of treatment for elevated cholesterol.  Cessation of smoking and exercise are also important in the management of elevated cholesterol and preventing vascular disease.  Studies have shown that 30 to 60 minutes of physical activity most days can help lower blood pressure, lower cholesterol, and keep your weight at a healthy level.  Drug therapy is used when cholesterol levels do not respond to therapeutic lifestyle changes (smoking cessation, diet, and exercise) and remains unacceptably high.  If medication is started, it is important to have you levels checked periodically to evaluate the need for further treatment options.  Thank you,    [employer] Team

## 2010-05-01 NOTE — Progress Notes (Signed)
Summary: refills--prozac, metoprolol, estradiol  Phone Note Refill Request Message from:  Patient on February 26, 2010 2:38 PM  Refills Requested: Medication #1:  PROZAC 40 MG CAPS Take 1 capsule by mouth once a day.   Dosage confirmed as above?Dosage Confirmed   Supply Requested: 1 month  Medication #2:  METOPROLOL TARTRATE 25 MG TABS Take 1 tablet by mouth twice a day   Dosage confirmed as above?Dosage Confirmed   Supply Requested: 1 month  Medication #3:  Estradiol 1mg  Take 1 tablet daily Pt is requesting refills. Estradiol was removed from med list on 02/12/10. Is it ok to refill these meds? When does pt need to f/u with [place]?  Next Appointment Scheduled: none with Melissa Initial call taken by: Mervin Kung CMA Duncan Dull),  February 26, 2010 2:43 PM  Follow-up for Phone Call        Pt has apt tomorrow.  OK to refill prozac, metoprol (3 months) I will check with Dr.Crenshaw about whether we can restart the estrogen. Thanks Follow-up by: Lemont Fillers FNP,  February 26, 2010 3:35 PM  Additional Follow-up for Phone Call Additional follow up Details #1::        Refills sent to pharmacy. I do not see appt with [place] in the system. Please advise. Nicki Guadalajara Fergerson CMA Duncan Dull)  February 26, 2010 4:15 PM     Additional Follow-up for Phone Call Additional follow up Details #2::    Looks like she no-showed.  Please call her and arrange a follow up. Lemont Fillers FNP,  February 26, 2010 4:38 PM  Spoke to pt and r/s f/u for 03/04/10. Pt states she told the front desk staff to r/s her appt for 11/30 b/c she was having a TEE? Appt did not get moved. Can we waive her no show fee? Nicki Guadalajara Fergerson CMA Duncan Dull)  February 27, 2010 8:47 AM   Please waive no show fee. Lemont Fillers FNP,  February 27, 2010 8:57 AM Notified Marj to waive fee. Nicki Guadalajara Fergerson CMA Duncan Dull)  March 03, 2010 9:04 AM   Please advise if pt needs to restart Estradiol? Nicki Guadalajara Fergerson CMA Duncan Dull)  February 27, 2010 9:16 AM   Additional Follow-up for Phone Call Additional follow up Details #3:: Details for Additional Follow-up Action Taken: I have sent note to Dr. Jens Som to clarify if estradiol can be restarted- their office removed it from her list. Lemont Fillers FNP,  February 27, 2010 11:12 AM  Have we heard anything from [name] yet? Nicki Guadalajara Fergerson CMA (AAMA)  March 11, 2010 11:11 AM   Prescriptions: METOPROLOL TARTRATE 25 MG TABS (METOPROLOL TARTRATE) Take 1 tablet by mouth twice a day  #30 x 2   Entered by:   Mervin Kung CMA (AAMA)   Authorized by:   Lemont Fillers FNP   Signed by:   Mervin Kung CMA (AAMA) on 02/26/2010   Method used:   Electronically to        [facility] # [phone]* (retail)       [address]       [place], [place]  [address]       Ph: [phone]       Fax: [phone]   RxID:   [id number] PROZAC 40 MG CAPS (FLUOXETINE HCL) Take 1 capsule by mouth once a day.  #30 x 2   Entered by:  Mervin Kung CMA (AAMA)   Authorized by:   Lemont Fillers FNP   Signed by:   Mervin Kung CMA (AAMA) on 02/26/2010   Method used:   Electronically to        [facility] # [phone]* (retail)       [address]       [place], [place]  [address]       Ph: [phone]       Fax: [phone]   RxID:   [id number]

## 2010-05-01 NOTE — Progress Notes (Signed)
  ROI faxed to [facility] @ [phone]...call back [phone] [facility]  February 10, 2010 10:09 AM     Appended Document:  Refaxed ROI again   Appended Document:  Recordsreceived from [facility] to [place]  Appended Document:  Called back to [facility] spoke with Medical Records, asked if they could fax the correct Records received wrong Pt's records..MR will fax.CB [phone]  Appended Document:  Received correct pt's records gave to [facility]

## 2010-05-01 NOTE — Progress Notes (Signed)
Summary: cough, body aches  Phone Note Call from Patient Call back at [phone]   Caller: Patient Call For: Lemont Fillers FNP Summary of Call: Woke up this morning with body aches all over. Pt states she is having chills and can't get warm.  More sob than usual after climbing stairs. Coughing up mucous that is yellow with red tinges. Please advise.  Initial call taken by: Mervin Kung CMA Duncan Dull),  March 17, 2010 11:59 AM  Follow-up for Phone Call        Advised pt per Melissa's direction that she needs to be evaluated in the office. Pt scheduled appt for today @ 3:15pm. Mervin Kung CMA Duncan Dull)  March 17, 2010 1:37 PM

## 2010-05-01 NOTE — Assessment & Plan Note (Signed)
Summary: cough, sob / tf,cma--Rm 4   Vital Signs:  Patient profile:   50 year old female Menstrual status:  partial hysterectomy Height:      62 inches Weight:      185 pounds BMI:     33.96 O2 Sat:      96 % on Room air Temp:     98.6 degrees F oral Pulse rate:   90 / minute Pulse rhythm:   regular Resp:     18 per minute BP sitting:   146 / 86  (right arm) Cuff size:   large  Vitals Entered By: Mervin Kung CMA Duncan Dull) (March 17, 2010 3:11 PM)  O2 Flow:  Room air CC: Pt states she has had cough x 1 week, increased mucus x 3 days, constant headache. Recently noticed red tinged mucus with cough. Is Patient Diabetic? No Comments Pt agrees all med doses and directions are correct. Nicki Guadalajara Fergerson CMA Duncan Dull)  March 17, 2010 3:17 PM    Primary Care Provider:  Lemont Fillers FNP  CC:  Pt states she has had cough x 1 week, increased mucus x 3 days, and constant headache. Recently noticed red tinged mucus with cough..  History of Present Illness: Ms. Gunderman is a 50 year old female here today with cheif complaint of cough. Cough is characterized as productive with yellow/blood tinged mucous.  Cough has been present for several weeks, but has worsenen in the last week or so.  Today has associated body and joint aches.  Also notes increased SOB with exertion.   Used inhaler with some improvement, then went to [employer] and was SOB again with walking.   Notes + sinus headache- + drainage nasal (yellowish). No known fever but + sweat.  +sore throat for 2 days- now resolved.  She has prior history of similar cough symptoms in the past.      Allergies (verified): No Known Drug Allergies  Past History:  Past Medical History: Last updated: 02/12/2010 HYPERTENSION Hyperlipidemia ECZEMA DEPRESSION GERD  ASTHMA Hx of Rheumatic fever-- murmur Hx of right shoulder injury Mitral stenosis and regurgitation  Past Surgical History: Last updated: 01/15/2010 Right femur  fracture--1983 C section -- 1991 Partial Hysterectomy-- 01/2004 Left breast biopsy (benign)-- 05/2007  Review of Systems       see HPI  Physical Exam  General:  Well-developed,well-nourished,in no acute distress; alert,appropriate and cooperative throughout examination Head:  Normocephalic and atraumatic without obvious abnormalities. No apparent alopecia or balding. Ears:  External ear exam shows no significant lesions or deformities.  Otoscopic examination reveals clear canals, tympanic membranes are intact bilaterally without bulging, retraction, inflammation or discharge. Hearing is grossly normal bilaterally. Mouth:  Oral mucosa and oropharynx without lesions or exudates.  Teeth in good repair. Neck:  No deformities, masses, or tenderness noted. Lungs:  Normal respiratory effort, chest expands symmetrically. + faint crackles noted bilaterally in mid lungs.  Somewhat diminished breath sounds at the bases.   Heart:  Normal rate and regular rhythm. grade 3/6 systolic murmur, E4-V4 regular rate and rhythm Extremities:  No clubbing, cyanosis, edema, or deformity noted with normal full range of motion of all joints.   Psych:  Cognition and judgment appear intact. Alert and cooperative with normal attention span and concentration. No apparent delusions, illusions, hallucinations   Impression & Recommendations:  Problem # 1:  CHF (ICD-428.0) Assessment Deteriorated Will  plan to increase her furosemide to a b.i.d. dosing schedule.  Will also continue ACE inhibitor and metoprolol. An  update has been sent to Dr. Jens Som with him she has followup in January. W'll check BNP. The patient is instructed to followup this Friday. She is instructed to go to the emergency room should she develop acute worsening shortness of breath between now and then.  May also have some overlapping viral symptoms- pt instructed to call if she develops fever over 101.   Her updated medication list for this problem  includes:    Zestril 40 Mg Tabs (Lisinopril) ..... One tablet by mouth once daily    Metoprolol Tartrate 25 Mg Tabs (Metoprolol tartrate) .Marland Kitchen... Take 1 tablet by mouth twice a day    Ecotrin Low Strength 81 Mg Tbec (Aspirin) .Marland Kitchen... Take 1 tablet by mouth once a day.    Furosemide 40 Mg Tabs (Furosemide) ..... One tablet by mouth once daily  Orders: CXR- 2view (CXR) T-BNP [facility]) (83880-BNP)  Complete Medication List: 1)  Soma 350 Mg Tabs (Carisoprodol) .... Take 1 tablet every 6 hours. 2)  Prozac 40 Mg Caps (Fluoxetine hcl) .... Take 1 capsule by mouth once a day. 3)  Flonase 50 Mcg/act Susp (Fluticasone propionate) .Marland Kitchen.. 1spray each nostril daily as needed. 4)  Albuterol Sulfate (2.5 Mg/63ml) 0.083% Nebu (Albuterol sulfate) .... Use 1 vial in nebulizer every 4 hours as needed. 5)  Zestril 40 Mg Tabs (Lisinopril) .... One tablet by mouth once daily 6)  Zocor 10 Mg Tabs (Simvastatin) .... Take 1 tablet by mouth once a dayran out last dose 3 days ago 7)  Metoprolol Tartrate 25 Mg Tabs (Metoprolol tartrate) .... Take 1 tablet by mouth twice a day 8)  Ecotrin Low Strength 81 Mg Tbec (Aspirin) .... Take 1 tablet by mouth once a day. 9)  Furosemide 40 Mg Tabs (Furosemide) .... One tablet by mouth once daily 10)  Prilosec Otc 20 Mg Tbec (Omeprazole magnesium) .... One tablet by mouth daily 11)  Proair Hfa 108 (90 Base) Mcg/act Aers (Albuterol sulfate) .... As needed 12)  Tessalon Perles 100 Mg Caps (Benzonatate) .... One cap by mouth three times a day as needed for cough  Patient Instructions: 1)  Please complete your chest x-ray on the first floor today. 2)  Follow up in 1 week- call sooner if symptoms worsen or do not improve.    Orders Added: 1)  CXR- 2view [CXR] 2)  T-BNP [facility]) [83880-BNP] 3)  New Patient Level IV [[address]]    Current Allergies (reviewed today): No known allergies

## 2010-05-01 NOTE — Assessment & Plan Note (Signed)
Summary: 1 month follow up/mhf--Rm 5   Vital Signs:  Patient profile:   50 year old female Menstrual status:  partial hysterectomy Height:      62 inches Weight:      184.50 pounds BMI:     33.87 O2 Sat:      98 % on Room air Temp:     97.7 degrees F oral Pulse rate:   90 / minute Pulse rhythm:   regular Resp:     18 per minute BP sitting:   150 / 92  (right arm) Cuff size:   large  Vitals Entered By: Mervin Kung CMA Duncan Dull) (March 11, 2010 9:18 AM)  O2 Flow:  Room air CC: Pt here for 1 month f/u. States she has had headache every day since stopping Estradiol. Feet have been swollen recently. Has body aches x 3 weeks. Cough still wakes her up at night. Also having swelling behind rt knee x 1 1/2 weeks. Is Patient Diabetic? No Comments Pt completed Zpack and tussionex. Needs refill on Metoprolol and Albuterol. Nicki Guadalajara Fergerson CMA Duncan Dull)  March 11, 2010 9:28 AM    Primary Care Provider:  Lemont Fillers FNP  CC:  Pt here for 1 month f/u. States she has had headache every day since stopping Estradiol. Feet have been swollen recently. Has body aches x 3 weeks. Cough still wakes her up at night. Also having swelling behind rt knee x 1 1/2 weeks.Marland Kitchen  History of Present Illness: Ms Julia Garza is a 50 year old female who presents today for routine follow up.  1) HRT- Pt reports that estradiol was started by GYN 5 years ago.  She is s/p partial hysterectomy in 2005 (ovaries intact).  She reports that she was having mood swings and her GYN placed her on estradiol in 2006. She was followed by [facility]- Dr. Lionel December.    2) + Cough- was doing well,  + productive mild yellow in color.  + foot swelling x 4 days.  Denies SOB. She has gained 5 pounds since her visit 1 month ago.  Notes that she has been eating in excess due to the holidays.  3) Musculoskeltal- + Back pain, and right knee swelling.  Knee hurts more with walking, + R shoulder pain.  4) Mitral valve  disease-  She has f/u with Dr. Jens Som on 1/11.  Plan is ultimately to have it surgically repaired.  5) HTN- Pt reports that she has not taken her AM meds due to fasting for FLP which was ordered by cardiology this AM.  Allergies (verified): No Known Drug Allergies  Past History:  Past Medical History: Last updated: 02/12/2010 HYPERTENSION Hyperlipidemia ECZEMA DEPRESSION GERD  ASTHMA Hx of Rheumatic fever-- murmur Hx of right shoulder injury Mitral stenosis and regurgitation  Past Surgical History: Last updated: 01/15/2010 Right femur fracture--1983 C section -- 1991 Partial Hysterectomy-- 01/2004 Left breast biopsy (benign)-- 05/2007  Review of Systems       see HPI  Physical Exam  General:  Well-developed,well-nourished,in no acute distress; alert,appropriate and cooperative throughout examination Head:  Normocephalic and atraumatic without obvious abnormalities. No apparent alopecia or balding. Neck:  No deformities, masses, or tenderness noted. No thyroid enlargement. Lungs:  Normal respiratory effort, chest expands symmetrically. Lungs are clear to auscultation, no crackles or wheezes. Heart:  Normal rate and regular rhythm. S1 and S2 normal + murmur grade 2/6 Msk:  Mild swelling noted right anterior knee.  Full ROM without crepitus. Extremities:  No  clubbing, cyanosis, edema, or deformity noted  Psych:  Cognition and judgment appear intact. Alert and cooperative with normal attention span and concentration. No apparent delusions, illusions, hallucinations   Impression & Recommendations:  Problem # 1:  HORMONE REPLACEMENT THERAPY (ICD-V07.4) Assessment New Will request records from her GYN.  She believes that she never had her hormone levels checked, but that it was started only due to "mood swings."  Her ovaries are intact.  Recommended that she hold the estradiol until we obtain her records and can make a decision on further HRT needs.  Pt agrees to this plan.     Problem # 2:  COUGH (ICD-786.2) Assessment: Unchanged Pt appears euvolemic today.  Suspect that her underlying valve disease is a contributing factor.  Will add tessalon perles as needed for now.  She is on ACE per cardiology, however her cough was present prior to her starting ACE.  Benefit of ACE is important at this time due to CHF.    Problem # 3:  KNEE PAIN, RIGHT (EXB-284.13) Assessment: New Suspect OA, recommended short term use of ibuprofen.   Her updated medication list for this problem includes:    Soma 350 Mg Tabs (Carisoprodol) .Marland Kitchen... Take 1 tablet every 6 hours.    Ecotrin Low Strength 81 Mg Tbec (Aspirin) .Marland Kitchen... Take 1 tablet by mouth once a day.  Problem # 4:  HYPERTENSION (ICD-401.9) Assessment: Deteriorated BP is up today, pt held AM meds.  Was stable last visit on dosing regimen.  Pt was instructed to resume her medications.  Will monitor.   Her updated medication list for this problem includes:    Zestril 40 Mg Tabs (Lisinopril) ..... One tablet by mouth once daily    Metoprolol Tartrate 25 Mg Tabs (Metoprolol tartrate) .Marland Kitchen... Take 1 tablet by mouth twice a day    Furosemide 40 Mg Tabs (Furosemide) ..... One tablet by mouth once daily  BP today: 150/92 Prior BP: 122/80 (02/14/2010)  Labs Reviewed: K+: 4.5 (01/31/2010) Creat: : 0.87 (01/31/2010)     Problem # 5:  MITRAL STENOSIS WITH INSUFFICIENCY (ICD-394.2) Assessment: Unchanged Clinically euvolemic today. Defer management to cardiology.   Her updated medication list for this problem includes:    Metoprolol Tartrate 25 Mg Tabs (Metoprolol tartrate) .Marland Kitchen... Take 1 tablet by mouth twice a day    Ecotrin Low Strength 81 Mg Tbec (Aspirin) .Marland Kitchen... Take 1 tablet by mouth once a day.  Complete Medication List: 1)  Soma 350 Mg Tabs (Carisoprodol) .... Take 1 tablet every 6 hours. 2)  Prozac 40 Mg Caps (Fluoxetine hcl) .... Take 1 capsule by mouth once a day. 3)  Flonase 50 Mcg/act Susp (Fluticasone propionate) .Marland Kitchen.. 1spray  each nostril daily as needed. 4)  Albuterol Sulfate (2.5 Mg/58ml) 0.083% Nebu (Albuterol sulfate) .... Use 1 vial in nebulizer every 4 hours as needed. 5)  Zestril 40 Mg Tabs (Lisinopril) .... One tablet by mouth once daily 6)  Zocor 10 Mg Tabs (Simvastatin) .... Take 1 tablet by mouth once a dayran out last dose 3 days ago 7)  Metoprolol Tartrate 25 Mg Tabs (Metoprolol tartrate) .... Take 1 tablet by mouth twice a day 8)  Ecotrin Low Strength 81 Mg Tbec (Aspirin) .... Take 1 tablet by mouth once a day. 9)  Furosemide 40 Mg Tabs (Furosemide) .... One tablet by mouth once daily 10)  Prilosec Otc 20 Mg Tbec (Omeprazole magnesium) .... One tablet by mouth daily 11)  Proair Hfa 108 (90 Base) Mcg/act Aers (Albuterol sulfate) .Marland KitchenMarland KitchenMarland Kitchen  As needed 12)  Tessalon Perles 100 Mg Caps (Benzonatate) .... One cap by mouth three times a day as needed for cough  Patient Instructions: 1)  Take 400-600mg  of Ibuprofen (Advil, Motrin) with food every 4-6 hours as needed for relief of pain for the next two weeks.  2)  Call if you develop shortness of breath or if you gain 3 pounds overnight. 3)  Follow up in 2 months.  Prescriptions: ALBUTEROL SULFATE (2.5 MG/3ML) 0.083% NEBU (ALBUTEROL SULFATE) Use 1 vial in nebulizer every 4 hours as needed.  #1 box x 1   Entered and Authorized by:   Lemont Fillers FNP   Signed by:   Lemont Fillers FNP on 03/11/2010   Method used:   Electronically to        [facility] # [phone]* (retail)       [address]       [place], [place]  [address]       Ph: [phone]       Fax: [phone]   RxID:   [id number] METOPROLOL TARTRATE 25 MG TABS (METOPROLOL TARTRATE) Take 1 tablet by mouth twice a day  #60 x 2   Entered and Authorized by:   Lemont Fillers FNP   Signed by:   Lemont Fillers FNP on 03/11/2010   Method used:   Electronically to        [facility] # [phone]* (retail)       [address]       [place], [place]  [address]       Ph: [phone]       Fax: [phone]   RxID:   [id number] TESSALON PERLES 100 MG CAPS (BENZONATATE) one cap by mouth three times a day as needed for cough  #30 x 1   Entered and Authorized by:   Lemont Fillers FNP   Signed by:   Lemont Fillers FNP on 03/11/2010   Method used:   Electronically to        [facility] # [phone]* (retail)       [address]       [place], [place]  [address]       Ph: [phone]       Fax: [phone]   RxID:   [id number]    Orders Added: 1)  Est. Patient Level IV [626[address]]    Current Allergies (reviewed today): No known allergies

## 2010-05-07 ENCOUNTER — Encounter: Payer: Self-pay | Admitting: Cardiology

## 2010-05-07 NOTE — Assessment & Plan Note (Signed)
Summary: [facility]   Visit Type:  Follow-up Primary Provider:  Lemont Fillers FNP  CC:  Gain WT- Cough- Chest congestion- Ribs pain- Right knee edema.  History of Present Illness: 50 year old female for f/u of MS/MR. An echocardiogram was performed in November of 2011 and revealed normal LV function, moderate left atrial enlargement, mild right atrial enlargement, moderate mitral regurgitation and mild to moderate mitral stenosis; mild AI. TEE in 11/11 revealed normal LV function and mild MS (valve area 2.4 cm) and mild AI/MR. Hospitalized in December with worsening heart failure symptoms. BNP elevated at 449. D-dimer mildly elevated but CTA showed no pulmonary embolus. There were pulmonary infiltrates felt most likely secondary to pulmonary edema. She improved with diuresis. Cardiac catheterization was performedin December of 2011 and showed normal LV function, normal coronary arteries, moderate mitral regurgitation and mild mitral stenosis with a valve area of 1.85 cm. She was discharged on diuretics. I last saw her in Jan 2012. Since then, she is not doing well. She has gained 10 pounds over the past 3 weeks. Her cough has recurred. It is productive of a whitish sputum. It increases with lying flat. There is no pedal edema, chest pain, palpitations, syncope, fevers, chills or hemoptysis. She is having some pain in her right knee.  Current Medications (verified): 1)  Soma 350 Mg Tabs (Carisoprodol) .... Take 1 Tablet Every 6 Hours. 2)  Prozac 40 Mg Caps (Fluoxetine Hcl) .... Take 1 Capsule By Mouth Once A Day. 3)  Flonase 50 Mcg/act Susp (Fluticasone Propionate) .Marland Kitchen.. 1spray Each Nostril Daily As Needed. 4)  Zestril 40 Mg Tabs (Lisinopril) .... One Tablet By Mouth Once Daily 5)  Metoprolol Tartrate 25 Mg Tabs (Metoprolol Tartrate) .... Take 1 Tablet By Mouth Twice A Day 6)  Ecotrin Low Strength 81 Mg Tbec (Aspirin) .... Take 1 Tablet By Mouth Once A Day. 7)  Furosemide 40 Mg Tabs  (Furosemide) .... One Tablet By Mouth Twice Daily 8)  Prilosec Otc 20 Mg Tbec (Omeprazole Magnesium) .... One Tablet By Mouth Daily 9)  Proair Hfa 108 (90 Base) Mcg/act Aers (Albuterol Sulfate) .... As Needed 10)  Tessalon Perles 100 Mg Caps (Benzonatate) .... One Cap By Mouth Three Times A Day As Needed For Cough  Allergies (verified): No Known Drug Allergies  Past History:  Past Medical History: Reviewed history from 02/12/2010 and no changes required. HYPERTENSION Hyperlipidemia ECZEMA DEPRESSION GERD  ASTHMA Hx of Rheumatic fever-- murmur Hx of right shoulder injury Mitral stenosis and regurgitation  Past Surgical History: Right femur fracture--1983 C section -- 1991 Partial Hysterectomy-- 01/2004 Left breast biopsy (benign)-- 05/2007  Social History: Reviewed history from 01/15/2010 and no changes required. 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 Remaining systems are negative.   Vital Signs:  Patient profile:   50 year old female Menstrual status:  partial hysterectomy Height:      62 inches Weight:      185 pounds BMI:     33.96 Pulse rate:   60 / minute Pulse rhythm:   regular Resp:     18 per minute BP sitting:   114 / 78  (left arm) Cuff size:   large  Vitals Entered  By: Vikki Ports (April 30, 2010 9:24 AM)  Physical Exam  General:  Well-developed well-nourished in no acute distress.  Skin is warm and dry.  HEENT is normal.  Neck is supple. No thyromegaly.  Chest is clear to auscultation with normal expansion.  Cardiovascular exam is regular rate and rhythm. 2/6 systolic murmur apex Abdominal exam nontender or distended. No  masses palpated. Extremities show no edema. neuro grossly intact    Impression & Recommendations:  Problem # 1:  MITRAL STENOSIS WITH INSUFFICIENCY (ICD-394.2) Patient returns for followup today. She is again having CHF symptoms. She has gained 10 pounds and is complaining of a cough with lying flat. I had previously felt that her mitral valve was not severe enough to be causing all of her symptoms. However I have no other explanation at this point. I will increase her Lasix to 60 mg p.o. b.i.d. Check potassium and renal function today and again in one week. Check BNP. I will refer her to [facility] for further evaluation by Dr. Regino Schultze. She may require mitral valve surgery. We will send copies of her previous transthoracic, transesophageal echocardiograms as well as her catheterization.  Problem # 2:  HYPERLIPIDEMIA (ICD-272.4) Management per primary care.  Problem # 3:  HYPERTENSION (ICD-401.9) I previously felt that elevated blood pressure may be making her mitral regurgitation worse and contributing to her congestive heart failure. However her blood pressure is well controlled now. Her updated medication list for this problem includes:    Zestril 40 Mg Tabs (Lisinopril) ..... One tablet by mouth once daily    Metoprolol Tartrate 25 Mg Tabs (Metoprolol tartrate) .Marland Kitchen... Take 1 tablet by mouth twice a day    Ecotrin Low Strength 81 Mg Tbec (Aspirin) .Marland Kitchen... Take 1 tablet by mouth once a day.    Furosemide 40 Mg Tabs (Furosemide) ..... One and one half  tablet by mouth twice daily  Her updated medication list for this problem includes:    Zestril 40 Mg Tabs (Lisinopril) ..... One tablet by mouth once daily    Metoprolol Tartrate 25 Mg Tabs (Metoprolol tartrate) .Marland Kitchen... Take 1 tablet by mouth twice a day    Ecotrin Low Strength 81 Mg Tbec (Aspirin) .Marland Kitchen... Take 1 tablet by mouth once a day.    Furosemide 40 Mg Tabs (Furosemide) ..... One and one half  tablet by mouth twice daily  Problem # 4:   CHF (ICD-428.0) Most likely secondary to her mitral valve disease. Increase Lasix as described above. Her updated medication list for this problem includes:    Zestril 40 Mg Tabs (Lisinopril) ..... One tablet by mouth once daily    Metoprolol Tartrate 25 Mg Tabs (Metoprolol tartrate) .Marland Kitchen... Take 1 tablet by mouth twice a day    Ecotrin Low Strength 81 Mg Tbec (Aspirin) .Marland Kitchen... Take 1 tablet by mouth once a day.    Furosemide 40 Mg Tabs (Furosemide) ..... One and one half  tablet by mouth twice daily  Orders: T-Basic Metabolic Panel [id number]) T-BNP  (B Natriuretic Peptide) [id number]) T-Basic Metabolic Panel [id number])  Other Orders: Misc. Referral (Misc. Ref)  Patient Instructions: 1)  Your physician recommends that you schedule a follow-up appointment in:  2)  Your physician recommends that you return for lab work in:ONE WEEK 3)  Your physician has recommended you make the following change in your medication: INCREASE FUROSEMIDE 40MG  ONE AND ONE HALF TABLETS TWICE DIALY 4)  You have been referred to DR Nolon Rod AT [facility] Prescriptions: FUROSEMIDE 40  MG TABS (FUROSEMIDE) one and one half  tablet by mouth twice daily  #90 x 12   Entered by:   Deliah Goody, RN   Authorized by:   Ferman Hamming, MD, [facility]   Signed by:   Deliah Goody, RN on 04/30/2010   Method used:   Electronically to        [facility] # [phone]* (retail)       [address]       [place], [place]  [address]       Ph: [phone]       Fax: [phone]   RxID:   [id number]

## 2010-05-07 NOTE — Progress Notes (Signed)
Summary: cough  Phone Note Call from Patient   Caller: Patient Call For: Lemont Fillers FNP Summary of Call: Pt is here now, just saw [name]. States she would like something for her rib pain from coughing so much. States Ibuprofen is not helping.  Initial call taken by: Mervin Kung CMA Duncan Dull),  April 30, 2010 10:03 AM  Follow-up for Phone Call        Please call in rx for Naproxen 500 mg by mouth twice daily as needed for rib pain.   #20 zero refills. Follow-up by: Lemont Fillers FNP,  April 30, 2010 10:07 AM  Additional Follow-up for Phone Call Additional follow up Details #1::        Pt has been advised. She is also requesting that we refill Tussionex as it seemed to work better than the [employer]. Pt wanted to let you know that she is being referred to Dr Regino Schultze at [facility] for further cardiac assessment and workup. Additional Follow-up by: Mervin Kung CMA Duncan Dull),  April 30, 2010 10:16 AM    Additional Follow-up for Phone Call Additional follow up Details #2::    Rx printed.   Please let patient know that I would only like her to use the tussionex at bedtime- otherwise she should use tessalon. Follow-up by: Lemont Fillers FNP,  April 30, 2010 11:03 AM  Additional Follow-up for Phone Call Additional follow up Details #3:: Details for Additional Follow-up Action Taken: Rxs called to [facility] main. Pt notified per Tyanna Hach's instructions. Nicki Guadalajara Fergerson CMA (AAMA)  April 30, 2010 11:10 AM   New/Updated Medications: Sandria Senter ER 10-8 MG/5ML LQCR (HYDROCOD POLST-CHLORPHEN POLST) one teaspoon by mouth at bedtime NAPROXEN 500 MG TABS (NAPROXEN) Take 1 tablet by mouth two times a day. Prescriptions: TUSSIONEX PENNKINETIC ER 10-8 MG/5ML LQCR (HYDROCOD POLST-CHLORPHEN POLST) one teaspoon by mouth at bedtime  #150 ml x 0   Entered and Authorized by:   Lemont Fillers FNP   Signed by:   Lemont Fillers FNP on  04/30/2010   Method used:   Printed then faxed to ...       [facility]  [address] # [phone]* (retail)       [id number]       [place], [place]  [address]       Ph: [phone]       Fax: [phone]   RxID:   [id number]

## 2010-05-07 NOTE — Assessment & Plan Note (Signed)
Summary: cough x 2 weeks/mhf--rm 5   Vital Signs:  Patient profile:   50 year old female Menstrual status:  partial hysterectomy Height:      62 inches Weight:      186 pounds BMI:     34.14 O2 Sat:      100 % on Room air Temp:     97.6 degrees F oral Pulse rate:   78 / minute Pulse rhythm:   regular Resp:     16 per minute BP sitting:   130 / 80  (right arm) Cuff size:   large  Vitals Entered By: Mervin Kung CMA Duncan Dull) (April 28, 2010 8:56 AM)  O2 Flow:  Room air CC: Pt states she has had a cough x 3 weeks despite OTC meds. Is Patient Diabetic? No Pain Assessment Patient in pain? no      Comments Pt completed Tessalon Perles and Cefuroxime. Has taken coricidin HBP for cough. Nicki Guadalajara Fergerson CMA Duncan Dull)  April 28, 2010 9:03 AM    Primary Care Provider:  Lemont Fillers FNP  CC:  Pt states she has had a cough x 3 weeks despite OTC meds..  History of Present Illness: [name] is a 50 year old female who presents today with chief complaint of cough. Cough started back on 1/14.  Initially cough resolved after diuresis in the hospital.  (She was discharged on 12/31).  Cough is associated with white sputum and two pillow orthopnea.  Cough is not associated with fever or significant swelling.   Denies shortness of breath during the day.   Has taken furosemide 40 mg once daily. She doubled up on Sunday of last week and once the week before.  Chest feels heavy.  Highest BP 124/76 at home.  She reports that she has been checking her BP twice daily. Today is the highest BP that she has seen.  She saw Dr.  Jens Som on 04/09/10 and weighed 179.5 pounds.  Today, her weight is 186.  Hospitalization records were reviewed for her 12/28- 12/31 hospitalization at [facility] during which time she underwent a cardiac catheterization which was negative for CAD and was diuresed.    Allergies (verified): No Known Drug Allergies  Past History:  Past Medical History: Last updated:  02/12/2010 HYPERTENSION Hyperlipidemia ECZEMA DEPRESSION GERD  ASTHMA Hx of Rheumatic fever-- murmur Hx of right shoulder injury Mitral stenosis and regurgitation  Past Surgical History: Right femur fracture--1983 C section -- 1991 Partial Hysterectomy-- 01/2004 Left breast biopsy (benign)-- 05/2007 Cardiac catheterization 12/11: Normal coronary arteries.  Normal LV fxn, ? mod. mitral regurg, mild AS.  Family History: Reviewed history from 01/15/2010 and no changes required. Mother--living, renal failure, heart disease, thyroid disease, diabetes, colon polyps  Father--deceased, stroke; heart disease 1 brother--alive and well 1 brother deceased from complications or cellulitis 3 children-- alive and well  Julia Garza- age 23 Julia Garza-23 Julia Garza-20- [name]  Social History: Reviewed history from 01/15/2010 and no changes required. 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 No apparent alopecia or balding. Neck:  No deformities, masses, or tenderness noted. Lungs:  Normal respiratory effort, chest expands symmetrically. Lungs are clear to auscultation, no crackles or wheezes. Heart:  Normal rate  and regular rhythm. S1 and S2 normal without gallop, murmur, click, rub or other extra sounds.  no JVD.   Abdomen:  Bowel sounds positive,abdomen soft and non-tender without masses, organomegaly or hernias noted. Extremities:  trace left pedal edema and trace right pedal edema.   Psych:  Cognition and judgment appear intact. Alert and cooperative with normal attention span and concentration. No apparent delusions, illusions, hallucinations   Impression &  Recommendations:  Problem # 1:  CHF (ICD-428.0) Assessment Deteriorated 50 year old female with 7 pound weight gain in last 2 weeks, now with orthopnea and worsening cough.  Suspect that this is volume related.  Sats are stable.  Will increase her furosemide to two times a day and arrange follow up with Dr.  Jens Som.   Her updated medication list for this problem includes:    Zestril 40 Mg Tabs (Lisinopril) ..... One tablet by mouth once daily    Metoprolol Tartrate 25 Mg Tabs (Metoprolol tartrate) .Marland Kitchen... Take 1 tablet by mouth twice a day    Ecotrin Low Strength 81 Mg Tbec (Aspirin) .Marland Kitchen... Take 1 tablet by mouth once a day.    Furosemide 40 Mg Tabs (Furosemide) ..... One tablet by mouth twice daily  Complete Medication List: 1)  Soma 350 Mg Tabs (Carisoprodol) .... Take 1 tablet every 6 hours. 2)  Prozac 40 Mg Caps (Fluoxetine hcl) .... Take 1 capsule by mouth once a day. 3)  Flonase 50 Mcg/act Susp (Fluticasone propionate) .Marland Kitchen.. 1spray each nostril daily as needed. 4)  Zestril 40 Mg Tabs (Lisinopril) .... One tablet by mouth once daily 5)  Metoprolol Tartrate 25 Mg Tabs (Metoprolol tartrate) .... Take 1 tablet by mouth twice a day 6)  Ecotrin Low Strength 81 Mg Tbec (Aspirin) .... Take 1 tablet by mouth once a day. 7)  Furosemide 40 Mg Tabs (Furosemide) .... One tablet by mouth twice daily 8)  Prilosec Otc 20 Mg Tbec (Omeprazole magnesium) .... One tablet by mouth daily 9)  Proair Hfa 108 (90 Base) Mcg/act Aers (Albuterol sulfate) .... As needed 10)  Tessalon Perles 100 Mg Caps (Benzonatate) .... One cap by mouth three times a day as needed for cough  Patient Instructions: 1)  Please arrange a follow up appointment this week with Dr. Jens Som.  2)  Go to ER if you develop severe shortness of breath. 3)  Weight yourself daily, call if you gain greater than 2 pounds overnight. Prescriptions: TESSALON PERLES 100 MG CAPS (BENZONATATE) one cap by mouth three times a day as needed for cough   #30 x 0   Entered and Authorized by:   Lemont Fillers FNP   Signed by:   Lemont Fillers FNP on 04/28/2010   Method used:   Electronically to        [facility] # [phone]* (retail)       [address]       [place], [place]  [address]       Ph: [phone]       Fax: [phone]   RxID:   [id number]    Orders Added: 1)  Est. Patient Level IV [73220]    Current Allergies (reviewed today): No known allergies   Appended Document: cough x 2 weeks/mhf--rm 5 make sure pt has fu appointment soon

## 2010-05-12 ENCOUNTER — Ambulatory Visit (INDEPENDENT_AMBULATORY_CARE_PROVIDER_SITE_OTHER): Payer: Medicare FFS | Admitting: Family

## 2010-05-12 ENCOUNTER — Encounter: Payer: Self-pay | Admitting: Family

## 2010-05-12 DIAGNOSIS — I509 Heart failure, unspecified: Secondary | ICD-10-CM

## 2010-05-12 DIAGNOSIS — R05 Cough: Secondary | ICD-10-CM

## 2010-05-14 LAB — CONVERTED CEMR LAB
BUN: 38 mg/dL — ABNORMAL HIGH (ref 6–23)
CO2: 30 meq/L (ref 19–32)
Calcium: 10.2 mg/dL (ref 8.4–10.5)
Chloride: 95 meq/L — ABNORMAL LOW (ref 96–112)
Creatinine, Ser: 1.31 mg/dL — ABNORMAL HIGH (ref 0.40–1.20)
Glucose, Bld: 111 mg/dL — ABNORMAL HIGH (ref 70–99)
Potassium: 5 meq/L (ref 3.5–5.3)
Sodium: 134 meq/L — ABNORMAL LOW (ref 135–145)

## 2010-05-21 NOTE — Assessment & Plan Note (Signed)
Summary: 2 month fu/mhf--rm 5   Vital Signs:  Patient profile:   50 year old female Menstrual status:  partial hysterectomy Height:      62 inches Weight:      188.50 pounds BMI:     34.60 Temp:     98.3 degrees F oral Pulse rate:   78 / minute Pulse rhythm:   regular Resp:     16 per minute BP sitting:   126 / 78  (right arm) Cuff size:   large  Vitals Entered By: Mervin Kung CMA (AAMA) (May 12, 2010 8:21 AM) CC: Pt here for 2 month follow up. Is Patient Diabetic? No Pain Assessment Patient in pain? yes     Location: rib Intensity: 10 Comments Pt states cough is better. Pt' Zestril has been changed to Losartan due to cough. Prilosec changed to Nexium. Pt is taking Bayer ASA 500mg  2 every 4 hours for pain. Nicki Guadalajara Fergerson CMA (AAMA)  May 12, 2010 8:30 AM    Primary Care Provider:  Lemont Fillers FNP  CC:  Pt here for 2 month follow up.Marland Kitchen  History of Present Illness: Ms.  Lartigue is a 50 year old female who presents for follow up of her CHF.  1)CHF/Mitral valve disease- Sincer her last visit,  she has been evaluated by Dr. Jens Som and was referred to Dr. Dr. Regino Schultze, Cardiologist at [facility].  She reports that she underwent a stress echo with Dr. Regino Schultze, and was told that she has moderate mitral regurgitation when under stress.  She was told that if she has an additional episode of volume overload, then they will refer her for MVR.  She sees him again in [facility]- March.  Notes that she develops SOB after she has been exerting herself for 5 minutes or so.  She is able to walk in from the parking lot without difficulty and perform short periods of exertion.   2) Cough-  She was switched from lisinopril to losartan on 2/8.  Cough is improved but not resolved.  She reports that the pain in her side due to coughing is still severe.  The only thing that has helped her is regular use of Bayer aspirin.  Has no had any improvement with the Aleve.  Tells me that she is no  longer using Soma.  Feels like she probably would be able to get reasonable relief with the tessalon perles at this point in place of the tussionex.      Allergies (verified): No Known Drug Allergies  Review of Systems       see HPI  Physical Exam  Head:  Normocephalic and atraumatic without obvious abnormalities. No apparent alopecia or balding. Neck:  No deformities, masses, or tenderness noted. Lungs:  Normal respiratory effort, chest expands symmetrically. Lungs are clear to auscultation, no crackles or wheezes. Heart:  Normal rate and regular rhythm. S1 and S2 normal without gallop, murmur, click, rub or other extra sounds. Msk:  + tenderness to palpation overlying right rib cage. Extremities:  No peripheral edema   Impression & Recommendations:  Problem # 1:  CHF (ICD-428.0) Assessment Comment Only Patient is up 9 pounds in the last 30 days.   Cardiology has increased her furosemide to 60 mg by mouth two times a day.  I have recommended that she continue to check her weight daily and call cardiology for further instructions if she gains any further weight.  She verbalizes understanding.   The following medications were removed from  the medication list:    Zestril 40 Mg Tabs (Lisinopril) ..... One tablet by mouth once daily Her updated medication list for this problem includes:    Metoprolol Tartrate 25 Mg Tabs (Metoprolol tartrate) .Marland Kitchen... Take 1 tablet by mouth twice a day    Ecotrin Low Strength 81 Mg Tbec (Aspirin) .Marland Kitchen... Take 1 tablet by mouth once a day.    Furosemide 40 Mg Tabs (Furosemide) ..... One and one half  tablet by mouth twice daily    Extra Strength Bayer 500 Mg Tabs (Aspirin) .Marland Kitchen... Take 2 every 4 hours.    Losartan Potassium 50 Mg Tabs (Losartan potassium) .Marland Kitchen... Take 1 tablet by mouth once a day.  Problem # 2:  COUGH (ICD-786.2) Assessment: Comment Only Patient's cough is improved with the switch to avapro, however it is not resolved.  Still suspect that  volume continues to exacerbate her cough.  Will have pt discontinue the soma and the tussionex.  Resume tessalon perles.  Use tylenol as needed pain and tramadol as needed for severe pain not relieved by tylenol.  Hopefully, this will be a short term treatment.   Complete Medication List: 1)  Prozac 40 Mg Caps (Fluoxetine hcl) .... Take 1 capsule by mouth once a day. 2)  Flonase 50 Mcg/act Susp (Fluticasone propionate) .Marland Kitchen.. 1spray each nostril daily as needed. 3)  Metoprolol Tartrate 25 Mg Tabs (Metoprolol tartrate) .... Take 1 tablet by mouth twice a day 4)  Ecotrin Low Strength 81 Mg Tbec (Aspirin) .... Take 1 tablet by mouth once a day. 5)  Furosemide 40 Mg Tabs (Furosemide) .... One and one half  tablet by mouth twice daily 6)  Proair Hfa 108 (90 Base) Mcg/act Aers (Albuterol sulfate) .... As needed 7)  Tessalon Perles 100 Mg Caps (Benzonatate) .... One cap by mouth three times a day as needed for cough 8)  Extra Strength Bayer 500 Mg Tabs (Aspirin) .... Take 2 every 4 hours. 9)  Losartan Potassium 50 Mg Tabs (Losartan potassium) .... Take 1 tablet by mouth once a day. 10)  Nexium 40 Mg Cpdr (Esomeprazole magnesium) .... Take 1 capsule by mouth once a day. 11)  Tramadol Hcl 50 Mg Tabs (Tramadol hcl) .... One tablet by mouth twice daily as needed for pain  Patient Instructions: 1)  Please call if symptoms worsen of do not improve.   2)  Please schedule a follow-up appointment in 2 weeks. Prescriptions: TESSALON PERLES 100 MG CAPS (BENZONATATE) one cap by mouth three times a day as needed for cough  #30 x 1   Entered and Authorized by:   Lemont Fillers FNP   Signed by:   Lemont Fillers FNP on 05/12/2010   Method used:   Electronically to        [facility] # [phone]* (retail)       [address]       [place], [place]  [address]       Ph: [phone]       Fax: [phone]   RxID:   [id number] TRAMADOL HCL 50 MG TABS (TRAMADOL HCL) one  tablet by mouth twice daily as needed for pain  #45 x 0   Entered and Authorized by:   Lemont Fillers FNP   Signed by:   Lemont Fillers FNP on 05/12/2010   Method used:   Electronically to        [facility]  [address] # [phone]* (retail)       [address]       [place], [place]  [address]       Ph: [phone]       Fax: [phone]   RxID:   [id number]    Orders Added: 1)  Est. Patient Level III [27253]    Current Allergies (reviewed today): No known allergies

## 2010-05-21 NOTE — Letter (Signed)
Summary: Dr[facility]   [facility]   Imported By: Marylou Mccoy 05/12/2010 15:48:08  _____________________________________________________________________  External Attachment:    Type:   Image     Comment:   External Document

## 2010-05-21 NOTE — Letter (Signed)
Summary: Dr. Tal[facility] C[facility]  Dr[facility] Talbert [facility]   Imported By: Marylou Mccoy 05/12/2010 15:51:06  _____________________________________________________________________  External Attachment:    Type:   Image     Comment:   External Document

## 2010-05-22 ENCOUNTER — Telehealth: Payer: Self-pay | Admitting: Family

## 2010-05-27 NOTE — Progress Notes (Signed)
Summary: rib pain  Phone Note Call from Patient Call back at Home Phone [phone]   Caller: Patient Call For: Lemont Fillers FNP Summary of Call: Received message from pt that her cough is almost resolved but her rib pain is getting worse. Still has pain med and is not requesting anything for that but wants [place] to be aware that the pain is worse. Please advise. Initial call taken by: Mervin Kung CMA Duncan Dull),  May 22, 2010 4:25 PM  Follow-up for Phone Call        Right sided rib pain, tender to touch. Tramadol, "knocks her out".  Recommended short term use of aleve one tab two times a day as needed.  Pt reports that her cough is better. Follow-up by: Lemont Fillers FNP,  May 23, 2010 8:33 AM

## 2010-06-04 ENCOUNTER — Telehealth: Payer: Self-pay | Admitting: Family

## 2010-06-04 DIAGNOSIS — N951 Menopausal and female climacteric states: Secondary | ICD-10-CM | POA: Insufficient documentation

## 2010-06-04 LAB — CONVERTED CEMR LAB: LH: 7.5 milliintl units/mL

## 2010-06-05 NOTE — Consult Note (Signed)
Summary: [facility]   [facility]   Imported By: Roderic Ovens 05/29/2010 12:01:09  _____________________________________________________________________  External Attachment:    Type:   Image     Comment:   External Document

## 2010-06-06 ENCOUNTER — Other Ambulatory Visit: Payer: Medicare FFS

## 2010-06-06 ENCOUNTER — Telehealth: Payer: Self-pay | Admitting: Family

## 2010-06-06 DIAGNOSIS — R61 Generalized hyperhidrosis: Secondary | ICD-10-CM | POA: Insufficient documentation

## 2010-06-09 ENCOUNTER — Encounter: Payer: Self-pay | Admitting: Family

## 2010-06-09 ENCOUNTER — Telehealth: Payer: Self-pay | Admitting: Cardiology

## 2010-06-09 ENCOUNTER — Other Ambulatory Visit (INDEPENDENT_AMBULATORY_CARE_PROVIDER_SITE_OTHER): Payer: Medicare FFS

## 2010-06-09 DIAGNOSIS — Z111 Encounter for screening for respiratory tuberculosis: Secondary | ICD-10-CM

## 2010-06-09 LAB — BASIC METABOLIC PANEL
BUN: 11 mg/dL (ref 6–23)
BUN: 13 mg/dL (ref 6–23)
CO2: 25 mEq/L (ref 19–32)
CO2: 27 mEq/L (ref 19–32)
Calcium: 9.3 mg/dL (ref 8.4–10.5)
Calcium: 9.7 mg/dL (ref 8.4–10.5)
Chloride: 105 mEq/L (ref 96–112)
Creatinine, Ser: 0.71 mg/dL (ref 0.4–1.2)
Creatinine, Ser: 0.8 mg/dL (ref 0.4–1.2)
GFR calc Af Amer: >60 mL/min (ref >60)
GFR calc Af Amer: >60 mL/min (ref >60)
GFR calc non Af Amer: >60 mL/min (ref >60)
Glucose, Bld: 112 mg/dL — ABNORMAL HIGH (ref 70–99)
Potassium: 4 mEq/L (ref 3.5–5.1)

## 2010-06-09 LAB — CBC
HCT: 33.7 % — ABNORMAL LOW (ref 36.0–46.0)
MCH: 30.7 pg (ref 26.0–34.0)
MCH: 30.9 pg (ref 26.0–34.0)
MCHC: 33.5 g/dL (ref 30.0–36.0)
MCV: 90.3 fL (ref 78.0–100.0)
MCV: 94.1 fL (ref 78.0–100.0)
Platelets: 395 10*3/uL (ref 150–400)
Platelets: 398 10*3/uL (ref 150–400)
Platelets: 441 10*3/uL — ABNORMAL HIGH (ref 150–400)
RBC: 3.4 MIL/uL — ABNORMAL LOW (ref 3.87–5.11)
RBC: 3.58 MIL/uL — ABNORMAL LOW (ref 3.87–5.11)
RBC: 3.78 MIL/uL — ABNORMAL LOW (ref 3.87–5.11)
RDW: 16.1 % — ABNORMAL HIGH (ref 11.5–15.5)
WBC: 11.4 10*3/uL — ABNORMAL HIGH (ref 4.0–10.5)
WBC: 6.8 10*3/uL (ref 4.0–10.5)
WBC: 7.6 10*3/uL (ref 4.0–10.5)

## 2010-06-09 LAB — POCT I-STAT 3, VENOUS BLOOD GAS (G3P V)
O2 Saturation: 59 %
pCO2, Ven: 43.6 mmHg — ABNORMAL LOW (ref 45.0–50.0)

## 2010-06-09 LAB — DIFFERENTIAL
Basophils Absolute: 0 10*3/uL (ref 0.0–0.1)
Basophils Relative: 0 % (ref 0–1)
Eosinophils Relative: 1 % (ref 0–5)
Monocytes Absolute: 0.7 10*3/uL (ref 0.1–1.0)
Monocytes Relative: 6 % (ref 3–12)

## 2010-06-09 LAB — COMPREHENSIVE METABOLIC PANEL
ALT: 16 U/L (ref 0–35)
AST: 30 U/L (ref 0–37)
Albumin: 4 g/dL (ref 3.5–5.2)
Alkaline Phosphatase: 76 U/L (ref 39–117)
Chloride: 105 mEq/L (ref 96–112)
GFR calc Af Amer: >60 mL/min (ref >60)
Potassium: 4.5 mEq/L (ref 3.5–5.1)
Sodium: 142 mEq/L (ref 135–145)
Total Bilirubin: 1.1 mg/dL (ref 0.3–1.2)
Total Protein: 7.6 g/dL (ref 6.0–8.3)

## 2010-06-09 LAB — CONVERTED CEMR LAB
Calcium: 9.6 mg/dL (ref 8.4–10.5)
Chloride: 100 meq/L (ref 96–112)
Creatinine, Ser: 1.02 mg/dL (ref 0.40–1.20)

## 2010-06-09 LAB — POCT I-STAT 3, ART BLOOD GAS (G3+)
Bicarbonate: 24.8 mEq/L — ABNORMAL HIGH (ref 20.0–24.0)
Bicarbonate: 26.4 mEq/L — ABNORMAL HIGH (ref 20.0–24.0)
Patient temperature: 37
pCO2 arterial: 32.9 mmHg — ABNORMAL LOW (ref 35.0–45.0)
pH, Arterial: 7.426 — ABNORMAL HIGH (ref 7.350–7.400)
pH, Arterial: 7.485 — ABNORMAL HIGH (ref 7.350–7.400)

## 2010-06-09 LAB — RENAL FUNCTION PANEL
Albumin: 3.6 g/dL (ref 3.5–5.2)
Calcium: 9.2 mg/dL (ref 8.4–10.5)
Chloride: 104 mEq/L (ref 96–112)
Creatinine, Ser: 0.73 mg/dL (ref 0.4–1.2)
GFR calc Af Amer: >60 mL/min (ref >60)
GFR calc non Af Amer: >60 mL/min (ref >60)
Sodium: 137 mEq/L (ref 135–145)

## 2010-06-09 LAB — ANA: Anti Nuclear Antibody(ANA): NEGATIVE

## 2010-06-09 LAB — POCT CARDIAC MARKERS
Myoglobin, poc: 43.2 ng/mL (ref 12–200)
Troponin i, poc: <0.05 ng/mL (ref 0.00–0.09)

## 2010-06-09 LAB — BRAIN NATRIURETIC PEPTIDE: Pro B Natriuretic peptide (BNP): 449 pg/mL — ABNORMAL HIGH (ref 0.0–100.0)

## 2010-06-09 LAB — PROTIME-INR: Prothrombin Time: 13.9 seconds (ref 11.6–15.2)

## 2010-06-10 NOTE — Progress Notes (Signed)
Summary: estradiol replacement?  Phone Note Call from Patient Call back at Home Phone [phone]   Caller: Patient Call For: Lemont Fillers FNP Summary of Call: Received message from pt stating she is having severe night sweats,  breast tenderness and weight gain since stopping the Estradiol. Pt does not feel like the weight gain is from fluid. Wants to know if she can try Estroven OTC or if there is something better she could try for these symptoms. Please advise.  Initial call taken by: Mervin Kung CMA Duncan Dull),  June 04, 2010 10:28 AM  Follow-up for Phone Call        Please ask patient to go to the lab to have her hormone levels checked.  If her estrogen is low, will plan resume her estradiol.   Follow-up by: Lemont Fillers FNP,  June 04, 2010 11:23 AM  Additional Follow-up for Phone Call Additional follow up Details #1::        Left detailed message re: Julia Garza's instruction and to call and let me know when she will have labs completed so order can be sent. Nicki Guadalajara Fergerson CMA Duncan Dull)  June 04, 2010 11:35 AM   New Problems: MENOPAUSE-RELATED VASOMOTOR SYMPTOMS, HOT FLASHES (ICD-627.2)   Additional Follow-up for Phone Call Additional follow up Details #2::    Pt returned my call and states that she will go to the lab today. Orders faxed. Nicki Guadalajara Fergerson CMA (AAMA)  June 04, 2010 11:51 AM   New Problems: MENOPAUSE-RELATED VASOMOTOR SYMPTOMS, HOT FLASHES (ICD-627.2)

## 2010-06-10 NOTE — Progress Notes (Signed)
Summary: lab results, additional testing  Phone Note Call from Patient Call back at Home Phone [phone]   Caller: Patient Call For: Lemont Fillers FNP Summary of Call: Pt called requesting lab results. Would like to know  if she can take something for her breast swelling and night sweats?  Please advise. Nicki Guadalajara Fergerson CMA Duncan Dull)  June 06, 2010 9:01 AM   Follow-up for Phone Call        Called patient, reviewed hormonal studies (normal) Pt does tell me that her weight is up to 193 lbs- she took an extra lasix yesterday.  Recommended that she call Dr. Jens Som if she has any further weight gain. She has pt with him on 3/14. Breast tenderness,  x 3 weeks.  Stopped estrogen 2 months ago.  + night sweats.  Recommended that patient schedule a nurse visit for PPD placement and to have HIV screening performed at lab.  Follow up with me in 2 weeks.  Nicki Guadalajara- could you pls call patient to arrange? Follow-up by: Lemont Fillers FNP,  June 06, 2010 10:04 AM  Additional Follow-up for Phone Call Additional follow up Details #1::        Notified pt. Scheduled nurse visit for 06/09/10, will give lab order to pt then. Pt will return Wednesday to have result read when she sees [name]. 2wk f/u with [facility] scheduled for 06/18/10 @ 9:45. Nicki Guadalajara Fergerson CMA (AAMA)  June 06, 2010 11:44 AM   New Problems: NIGHT SWEATS (ICD-780.8)   New Problems: NIGHT SWEATS (ICD-780.8)

## 2010-06-11 ENCOUNTER — Encounter: Payer: Self-pay | Admitting: Family

## 2010-06-11 ENCOUNTER — Encounter: Payer: Self-pay | Admitting: Cardiology

## 2010-06-11 ENCOUNTER — Ambulatory Visit: Payer: Self-pay | Admitting: Cardiology

## 2010-06-11 ENCOUNTER — Ambulatory Visit (INDEPENDENT_AMBULATORY_CARE_PROVIDER_SITE_OTHER): Payer: Managed Care, Other (non HMO) | Admitting: Cardiology

## 2010-06-11 DIAGNOSIS — I10 Essential (primary) hypertension: Secondary | ICD-10-CM

## 2010-06-11 DIAGNOSIS — I059 Rheumatic mitral valve disease, unspecified: Secondary | ICD-10-CM

## 2010-06-11 LAB — POCT CARDIAC MARKERS
CKMB, poc: <1 ng/mL — ABNORMAL LOW (ref 1.0–8.0)
Myoglobin, poc: 79 ng/mL (ref 12–200)
Troponin i, poc: <0.05 ng/mL (ref 0.00–0.09)

## 2010-06-11 LAB — CBC
HCT: 34.1 % — ABNORMAL LOW (ref 36.0–46.0)
MCV: 96.2 fL (ref 78.0–100.0)
Platelets: 454 10*3/uL — ABNORMAL HIGH (ref 150–400)
RBC: 3.54 MIL/uL — ABNORMAL LOW (ref 3.87–5.11)
WBC: 11.8 10*3/uL — ABNORMAL HIGH (ref 4.0–10.5)

## 2010-06-11 LAB — BASIC METABOLIC PANEL
Chloride: 107 mEq/L (ref 96–112)
GFR calc Af Amer: >60 mL/min (ref >60)
Potassium: 3.9 mEq/L (ref 3.5–5.1)
Sodium: 141 mEq/L (ref 135–145)

## 2010-06-11 LAB — POCT B-TYPE NATRIURETIC PEPTIDE (BNP): B Natriuretic Peptide, POC: 610 pg/mL — ABNORMAL HIGH (ref 0–100)

## 2010-06-11 LAB — DIFFERENTIAL
Eosinophils Relative: 1 % (ref 0–5)
Lymphocytes Relative: 16 % (ref 12–46)
Lymphs Abs: 1.9 10*3/uL (ref 0.7–4.0)
Monocytes Absolute: 1.7 10*3/uL — ABNORMAL HIGH (ref 0.1–1.0)

## 2010-06-17 ENCOUNTER — Encounter (INDEPENDENT_AMBULATORY_CARE_PROVIDER_SITE_OTHER): Payer: Managed Care, Other (non HMO) | Admitting: Thoracic Surgery (Cardiothoracic Vascular Surgery)

## 2010-06-17 ENCOUNTER — Other Ambulatory Visit: Payer: Self-pay | Admitting: Thoracic Surgery (Cardiothoracic Vascular Surgery)

## 2010-06-17 ENCOUNTER — Encounter: Payer: Managed Care, Other (non HMO) | Admitting: Thoracic Surgery (Cardiothoracic Vascular Surgery)

## 2010-06-17 ENCOUNTER — Other Ambulatory Visit: Payer: Self-pay | Admitting: Surgery

## 2010-06-17 DIAGNOSIS — I7409 Other arterial embolism and thrombosis of abdominal aorta: Secondary | ICD-10-CM

## 2010-06-17 DIAGNOSIS — I059 Rheumatic mitral valve disease, unspecified: Secondary | ICD-10-CM

## 2010-06-17 DIAGNOSIS — I050 Rheumatic mitral stenosis: Secondary | ICD-10-CM

## 2010-06-17 NOTE — Assessment & Plan Note (Signed)
Summary: PPD reading  Nurse Visit   Primary Care Provider:  Lemont Fillers FNP   History of Present Illness: CC:  TB skin test recheck   The patient presented after 48 hours to check the injection site for positive or negative reaction.  Injection site examination: No firm bump forms at the test site.  Slightly reddish appearance and diameter was smaller than 5mm.   Assessment & Plan: Negative TB skin test.  Patient was counseled to call if she experiences any irritation of site.  Nicki Guadalajara Fergerson CMA Duncan Dull)  June 11, 2010 9:35 AM    Allergies: No Known Drug Allergies  PPD Results    Date of reading: 06/11/2010    Results: < 5mm    Interpretation: negative

## 2010-06-17 NOTE — Progress Notes (Signed)
Summary: refill  Phone Note Refill Request Call back at Home Phone [phone] Message from:  Patient on June 09, 2010 1:28 PM  Refills Requested: Medication #1:  FUROSEMIDE 40 MG TABS one and one half  tablet by mouth twice daily Send to [facility]  Initial call taken by: Judie Grieve,  June 09, 2010 1:29 PM    Prescriptions: FUROSEMIDE 40 MG TABS (FUROSEMIDE) one and one half  tablet by mouth twice daily  #90 x 3   Entered by:   Burnett Kanaris, CNA   Authorized by:   Ferman Hamming, MD, [facility]   Signed by:   Burnett Kanaris, CNA on 06/10/2010   Method used:   Electronically to        [facility] # [phone]* (retail)       [address]       [place], [place]  [address]       Ph: [phone]       Fax: [phone]   RxID:   [id number]

## 2010-06-17 NOTE — Assessment & Plan Note (Signed)
Summary: F3M/DM   Visit Type:  Follow-up Primary Provider:  Lemont Fillers FNP  CC:  Fluid retention.  History of Present Illness: 50 year old female for f/u of MS/MR. An echocardiogram was performed in November of 2011 and revealed normal LV function, moderate left atrial enlargement, mild right atrial enlargement, moderate mitral regurgitation and mild to moderate mitral stenosis; mild AI. TEE in 11/11 revealed normal LV function and mild MS (valve area 2.4 cm) and mild AI/MR. Hospitalized in December with worsening heart failure symptoms. BNP elevated at 449. D-dimer mildly elevated but CTA showed no pulmonary embolus. There were pulmonary infiltrates felt most likely secondary to pulmonary edema. She improved with diuresis. Cardiac catheterization was performed in December of 2011 and showed normal LV function, normal coronary arteries, moderate mitral regurgitation and mild mitral stenosis with a valve area of 1.85 cm. She was discharged on diuretics. I last saw her in February of 2012. We increased her diuretics secondary to volume excess. I also referred her to [facility] for evaluation of her mitral valve. A stress echocardiogram apparently was performed to evaluate the severity of her mitral stenosis. I do not have those records available but the patient states that she was told her gradients increased and she needed mitral valve replacement.. Given the severity of her mitral regurgitation it was felt that balloon valvuloplasty would not be feasible. Since I last saw her, she does note dyspnea on exertion but no orthopnea, PND, pedal edema,  or syncope. No chest pain.  Current Medications (verified): 1)  Prozac 40 Mg Caps (Fluoxetine Hcl) .... Take 1 Capsule By Mouth Once A Day. 2)  Flonase 50 Mcg/act Susp (Fluticasone Propionate) .Marland Kitchen.. 1spray Each Nostril Daily As Needed. 3)  Metoprolol Tartrate 25 Mg Tabs (Metoprolol Tartrate) .... Take 1 Tablet By Mouth Twice A Day 4)  Ecotrin  Low Strength 81 Mg Tbec (Aspirin) .... Take 1 Tablet By Mouth Once A Day. 5)  Furosemide 40 Mg Tabs (Furosemide) .... One and One Half  Tablet By Mouth Twice Daily 6)  Proair Hfa 108 (90 Base) Mcg/act Aers (Albuterol Sulfate) .... As Needed 7)  Losartan Potassium 50 Mg Tabs (Losartan Potassium) .... Take 1 Tablet By Mouth Once A Day. 8)  Prilosec Otc 20 Mg Tbec (Omeprazole Magnesium) .... Take 1 Tablet By Mouth Once A Day 9)  Tramadol Hcl 50 Mg Tabs (Tramadol Hcl) .... One Tablet By Mouth Twice Daily As Needed For Pain  Allergies (verified): No Known Drug Allergies  Past History:  Past Medical History: HYPERTENSION Hyperlipidemia ECZEMA DEPRESSION GERD  ASTHMA Hx of Rheumatic fever Hx of right shoulder injury Mitral stenosis and regurgitation  Past Surgical History: Reviewed history from 04/30/2010 and no changes required. Right femur fracture--1983 C section -- 1991 Partial Hysterectomy-- 01/2004 Left breast biopsy (benign)-- 05/2007  Social History: Reviewed history from 01/15/2010 and no changes required. 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 Remaining systems are negative.   Vital Signs:  Patient profile:   50 year old female Menstrual status:  partial hysterectomy Height:      62 inches Weight:      187.50 pounds BMI:     34.42 Pulse rate:   64 / minute Pulse rhythm:   regular Resp:  18 per minute BP sitting:   120 / 80  (left arm) Cuff size:   large  Vitals Entered By: Vikki Ports (June 11, 2010 9:02 AM)  Physical Exam  General:  Well-developed well-nourished in no acute distress.  Skin is warm and dry.  HEENT is normal.  Neck is supple. No  thyromegaly.  Chest is clear to auscultation with normal expansion.  Cardiovascular exam is regular rate and rhythm.  Abdominal exam nontender or distended. No masses palpated. Extremities show no edema. neuro grossly intact    EKG  Procedure date:  06/11/2010  Findings:      Sinus rhythm with no ST changes.  Impression & Recommendations:  Problem # 1:  MITRAL STENOSIS WITH INSUFFICIENCY (ICD-394.2) Patient has been seen at [facility]. Her stress echocardiogram apparently revealed worsening gradient with exercise. She therefore will require mitral valve replacement. I will arrange for her to be seen by cardiothoracic surgery for consideration via mini thoracotomy. She already had her cardiac catheterization. She will continue on her Lasix at present dose. She will take an additional 60 mg daily as needed. She would like to proceed with surgery in late April. Will obtain final results of stress echocardiogram from [facility]. Orders: TCTS Referral (TCTS Ref)  Problem # 2:  CHF (ICD-428.0) As per #1. The following medications were removed from the medication list:    Extra Strength Bayer 500 Mg Tabs (Aspirin) .Marland Kitchen... Take 2 every 4 hours. Her updated medication list for this problem includes:    Metoprolol Tartrate 25 Mg Tabs (Metoprolol tartrate) .Marland Kitchen... Take 1 tablet by mouth twice a day    Ecotrin Low Strength 81 Mg Tbec (Aspirin) .Marland Kitchen... Take 1 tablet by mouth once a day.    Furosemide 40 Mg Tabs (Furosemide) ..... One and one half  tablet by mouth twice daily    Losartan Potassium 50 Mg Tabs (Losartan potassium) .Marland Kitchen... Take 1 tablet by mouth once a day.  Problem # 3:  HYPERTENSION (ICD-401.9) Blood pressure controlled with present medications. Will continue. The following medications were removed from the medication list:    Extra Strength Bayer 500 Mg Tabs (Aspirin) .Marland Kitchen... Take 2 every 4 hours. Her updated medication list for this problem includes:    Metoprolol Tartrate  25 Mg Tabs (Metoprolol tartrate) .Marland Kitchen... Take 1 tablet by mouth twice a day    Ecotrin Low Strength 81 Mg Tbec (Aspirin) .Marland Kitchen... Take 1 tablet by mouth once a day.    Furosemide 40 Mg Tabs (Furosemide) ..... One and one half  tablet by mouth twice daily    Losartan Potassium 50 Mg Tabs (Losartan potassium) .Marland Kitchen... Take 1 tablet by mouth once a day.  Problem # 4:  GERD (ICD-530.81)  Her updated medication list for this problem includes:    Prilosec Otc 20 Mg Tbec (Omeprazole magnesium) .Marland Kitchen... Take 1 tablet by mouth once a day  Problem # 5:  HYPERLIPIDEMIA (ICD-272.4)  Patient Instructions: 1)  Your physician recommends that you schedule a follow-up appointment in:  2)  You have been referred to [facility]

## 2010-06-18 ENCOUNTER — Ambulatory Visit: Payer: Medicare FFS | Admitting: Family
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
## 2010-06-27 ENCOUNTER — Encounter: Payer: Self-pay | Admitting: Family

## 2010-06-27 ENCOUNTER — Ambulatory Visit
Admission: RE | Admit: 2010-06-27 | Discharge: 2010-06-27 | Disposition: A | Discharge status: Home / Self Care | Payer: Managed Care, Other (non HMO) | Source: Ambulatory Visit | Attending: Thoracic Surgery (Cardiothoracic Vascular Surgery) | Admitting: Thoracic Surgery (Cardiothoracic Vascular Surgery)

## 2010-06-27 DIAGNOSIS — I7409 Other arterial embolism and thrombosis of abdominal aorta: Secondary | ICD-10-CM

## 2010-06-27 MED ORDER — IOHEXOL 300 MG/ML  SOLN
100.0000 mL | Freq: Once | INTRAMUSCULAR | Status: AC | PRN
  Administered 2010-06-27: 100 mL via INTRAVENOUS

## 2010-06-30 ENCOUNTER — Encounter: Payer: Self-pay | Admitting: Family

## 2010-06-30 ENCOUNTER — Ambulatory Visit (INDEPENDENT_AMBULATORY_CARE_PROVIDER_SITE_OTHER): Payer: Managed Care, Other (non HMO) | Admitting: Family

## 2010-06-30 ENCOUNTER — Ambulatory Visit (HOSPITAL_BASED_OUTPATIENT_CLINIC_OR_DEPARTMENT_OTHER)
Admission: RE | Admit: 2010-06-30 | Discharge: 2010-06-30 | Disposition: A | Discharge status: Home / Self Care | Payer: Managed Care, Other (non HMO) | Source: Ambulatory Visit | Attending: Family | Admitting: Family

## 2010-06-30 ENCOUNTER — Ambulatory Visit (INDEPENDENT_AMBULATORY_CARE_PROVIDER_SITE_OTHER): Payer: Managed Care, Other (non HMO) | Admitting: Family Medicine

## 2010-06-30 ENCOUNTER — Telehealth: Payer: Self-pay | Admitting: Family

## 2010-06-30 VITALS — BP 128/80 | HR 72 | Temp 98.2°F | Resp 16 | Ht 62.01 in | Wt 192.0 lb

## 2010-06-30 VITALS — BP 124/82 | HR 65 | Temp 98.1°F | Ht 61.0 in | Wt 192.0 lb

## 2010-06-30 DIAGNOSIS — M25569 Pain in unspecified knee: Secondary | ICD-10-CM

## 2010-06-30 DIAGNOSIS — R937 Abnormal findings on diagnostic imaging of other parts of musculoskeletal system: Secondary | ICD-10-CM | POA: Insufficient documentation

## 2010-06-30 DIAGNOSIS — M25561 Pain in right knee: Secondary | ICD-10-CM

## 2010-06-30 DIAGNOSIS — M7120 Synovial cyst of popliteal space [Baker], unspecified knee: Secondary | ICD-10-CM

## 2010-06-30 DIAGNOSIS — M25469 Effusion, unspecified knee: Secondary | ICD-10-CM | POA: Insufficient documentation

## 2010-06-30 MED ORDER — MELOXICAM 15 MG PO TABS: 15.0000 mg | ORAL_TABLET | Freq: Every day | ORAL | Status: DC

## 2010-06-30 NOTE — Patient Instructions (Signed)
Please complete your x-ray of your knee downstairs on the first floor. You will be contacted about your referral to Dr. Pearletha Forge- Sports Medicine. Good luck with your upcoming surgery.

## 2010-06-30 NOTE — Telephone Encounter (Signed)
Please call patient and let her know that knee x-ray shows mild arthritis.  She also has what appears to be a cyst behind her knee.  This is a common finding.  I would like for her to complete an ultrasound of her leg to evaluate this cyst please.

## 2010-06-30 NOTE — Progress Notes (Signed)
  Subjective:    Patient ID: Julia Garza, female    DOB: 1960-12-03, 50 y.o.   MRN: [id number]  HPI  [name] is a 50 yr old female who presents today with chief complaint of R knee pain.    R knee pain/swelling intermittent x 5 months.  Chronic pain in the right knee. Using naproxsyn.   Mitral valve disease- Pt has apt on 4/17 with Dr. Cornelius Moras for MVR- mechanical valve.   Too sob to exercise.  She is up 5 pounds since her apt in March.  Review of Systems  Constitutional: Positive for unexpected weight change. Negative for fever.  Respiratory: Positive for cough.        Objective:   Physical Exam  Constitutional: She appears well-developed and well-nourished. No distress.  Musculoskeletal:       R knee with + effusion.  Mild tenderness noted beneath the patella.  Able to extend/flex knee.          Assessment & Plan:

## 2010-06-30 NOTE — Telephone Encounter (Signed)
Pt.notified

## 2010-06-30 NOTE — Patient Instructions (Signed)
Your exam is consistent with either a degenerative meniscal tear or mild arthritis (there is mild arthritis on your x-rays). Treatment for both is the same initially. Ice your knee 15 minutes at a time as needed 3-4 times a day. Take meloxicam 15mg  daily with food for pain, inflammation, and swelling. ACE wrap may help keep swelling down. Elevate above the level of your heart as much as possible. Start straight leg raises and quad sets - 3 sets of 15 each once a day. Follow up with me after your post-operative period (the 3 weeks of recovery) for a recheck on how you are doing. An injection and/or physical therapy are the next considerations for this.

## 2010-07-01 ENCOUNTER — Telehealth: Payer: Self-pay | Admitting: *Deleted

## 2010-07-01 ENCOUNTER — Encounter: Payer: Self-pay | Admitting: Family Medicine

## 2010-07-01 MED ORDER — FLUTICASONE PROPIONATE 50 MCG/ACT NA SUSP: 2.0000 | Freq: Every day | NASAL | Status: DC

## 2010-07-01 NOTE — Progress Notes (Signed)
Subjective:    Patient ID: Julia Garza, female    DOB: Jun 06, 1960, 50 y.o.   MRN: [id number]  HPI 50 yo F here with right knee pain  Patient reports no known injury. Pain started in November 2011 - was intermittent about that time but has been persistent over past 2 weeks. Pain mostly medial but also posterior. Associated with swelling but no bruising. No catching, locking, or giving out. Tried naprosyn, elevation without much help. No prior right knee pain Had x-rays (non weight bearing) which showed mild medial DJD.  Past Medical History  Diagnosis Date  . Hypertension   . Hyperlipidemia   . GERD (gastroesophageal reflux disease)   . Depression   . Asthma   . Eczema   . History of rheumatic fever   . Right shoulder injury     history of  . Mitral stenosis with regurgitation     Current Outpatient Prescriptions on File Prior to Visit  Medication Sig Dispense Refill  . albuterol (PROAIR HFA) 108 (90 BASE) MCG/ACT inhaler Inhale 2 puffs into the lungs as needed.        Marland Kitchen aspirin 81 MG EC tablet Take 81 mg by mouth daily.        Marland Kitchen FLUoxetine (PROZAC) 40 MG capsule Take 40 mg by mouth daily.        . fluticasone (FLONASE) 50 MCG/ACT nasal spray 1 spray by Each Nare route daily.        . furosemide (LASIX) 40 MG tablet Take 1 1/2 tablet by mouth daily.       Marland Kitchen loratadine (CLARITIN) 10 MG tablet Take 10 mg by mouth daily.        Marland Kitchen losartan (COZAAR) 50 MG tablet Take 50 mg by mouth daily.        . meloxicam (MOBIC) 15 MG tablet Take 15 mg by mouth daily. With food.       . metoprolol tartrate (LOPRESSOR) 25 MG tablet Take 25 mg by mouth 2 (two) times daily.        Marland Kitchen omeprazole (PRILOSEC OTC) 20 MG tablet Take 20 mg by mouth daily.        . traMADol (ULTRAM) 50 MG tablet Take 50 mg by mouth 2 (two) times daily as needed. For pain         Past Surgical History  Procedure Date  . Cesarean section 1991  . Abdominal hysterectomy 01/2004    partial  . Breast surgery 05/2007   left breast biposy  . Femur fracture surgery 1983    right    No Known Allergies  History   Social History  . Marital Status: Single    Spouse Name: N/A    Number of Children: 3  . Years of Education: N/A   Occupational History  . SALES    Social History Main Topics  . Smoking status: Former Games developer  . Smokeless tobacco: Not on file   Comment: briefly as teenager  . Alcohol Use: No  . Drug Use: No  . Sexually Active: Not on file   Other Topics Concern  . Not on file   Social History Narrative   Regular exercise: yes    Family History  Problem Relation Age of Onset  . Heart disease Mother   . Kidney disease Mother   . Diabetes Mother   . Thyroid disease Mother   . Colon polyps Mother   . Heart attack Mother   . Hypertension Mother   . Heart  disease Father   . Heart attack Father     BP 124/82  Pulse 65  Temp(Src) 98.1 F (36.7 C) (Oral)  Ht 5\' 1"  (1.549 m)  Wt 192 lb (87.091 kg)  BMI 36.28 kg/m2  Review of Systems See HPI above.    Objective:   Physical Exam Gen: NAD R knee: Mild swelling.  No bruising, erythema, warmth. Mod TTP medial joint line.  No lateral or post patellar TTP.  No other TTP about knee. FROM. Stable to valgus/varus stress.  Negative ant/post drawer, negative lachmanns Pain medially with mcmurrays, negative laterally Negative apprehension.    Assessment & Plan:  1. R knee pain - DJD vs degenerative medial meniscal tear.  Reviewed x-rays with patient.  Would like to try oral medication especially given that she's having heart surgery in a few weeks.  Then consider PT, cortisone injection.  Will ice, use ace wrap, elevate as needed.  Straight leg raises and quad sets for strengthening.  F/u a few weeks after surgery when out of post-op period.

## 2010-07-01 NOTE — Assessment & Plan Note (Signed)
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  Reviewed x-rays with patient.  Would like to try oral medication especially given that she's having heart surgery in a few weeks.  Then consider PT, cortisone injection.  Will ice, use ace wrap, elevate as needed.  Straight leg raises and quad sets for strengthening.  F/u a few weeks after surgery when out of post-op period.

## 2010-07-01 NOTE — Telephone Encounter (Signed)
Please have pt increase flonase to 2 sprays each nostril daily.  She can try zyrtec in place of claritin.  She can also try saline nasal spray prior to flonase.

## 2010-07-01 NOTE — Telephone Encounter (Signed)
Pt is now having sinus congestion and cannot breathe through nose.  She is only taking Claritin for this.  Pt states she used to take Claritin-D, but can no longer take that.  Please advise at what pt can take.

## 2010-07-01 NOTE — Telephone Encounter (Signed)
Advised pt of advice below.  She is agreeable and voices good understanding.

## 2010-07-02 NOTE — Assessment & Plan Note (Signed)
Will refer patient to see Dr. Pearletha Forge for further evaluation.  Will also check x-ray of the right knee.

## 2010-07-10 ENCOUNTER — Encounter (HOSPITAL_COMMUNITY)
Admission: RE | Admit: 2010-07-10 | Discharge: 2010-07-10 | Disposition: A | Discharge status: Home / Self Care | Payer: Managed Care, Other (non HMO) | Source: Ambulatory Visit | Attending: Thoracic Surgery (Cardiothoracic Vascular Surgery) | Admitting: Thoracic Surgery (Cardiothoracic Vascular Surgery)

## 2010-07-10 ENCOUNTER — Other Ambulatory Visit: Payer: Self-pay | Admitting: Thoracic Surgery (Cardiothoracic Vascular Surgery)

## 2010-07-10 ENCOUNTER — Ambulatory Visit (HOSPITAL_COMMUNITY)
Admission: RE | Admit: 2010-07-10 | Discharge: 2010-07-10 | Disposition: A | Discharge status: Home / Self Care | Payer: Managed Care, Other (non HMO) | Source: Ambulatory Visit | Attending: Thoracic Surgery (Cardiothoracic Vascular Surgery) | Admitting: Thoracic Surgery (Cardiothoracic Vascular Surgery)

## 2010-07-10 ENCOUNTER — Inpatient Hospital Stay (HOSPITAL_COMMUNITY)
Admission: RE | Admit: 2010-07-10 | Discharge: 2010-07-10 | Disposition: A | Discharge status: Home / Self Care | Payer: Managed Care, Other (non HMO) | Source: Ambulatory Visit | Attending: Thoracic Surgery (Cardiothoracic Vascular Surgery) | Admitting: Thoracic Surgery (Cardiothoracic Vascular Surgery)

## 2010-07-10 DIAGNOSIS — I059 Rheumatic mitral valve disease, unspecified: Secondary | ICD-10-CM

## 2010-07-10 DIAGNOSIS — Z01810 Encounter for preprocedural cardiovascular examination: Secondary | ICD-10-CM | POA: Insufficient documentation

## 2010-07-10 DIAGNOSIS — Z01812 Encounter for preprocedural laboratory examination: Secondary | ICD-10-CM | POA: Insufficient documentation

## 2010-07-10 DIAGNOSIS — Z01818 Encounter for other preprocedural examination: Secondary | ICD-10-CM | POA: Insufficient documentation

## 2010-07-10 DIAGNOSIS — I050 Rheumatic mitral stenosis: Secondary | ICD-10-CM

## 2010-07-10 LAB — COMPREHENSIVE METABOLIC PANEL
BUN: 19 mg/dL (ref 6–23)
CO2: 24 mEq/L (ref 19–32)
Calcium: 10.1 mg/dL (ref 8.4–10.5)
Chloride: 102 mEq/L (ref 96–112)
Creatinine, Ser: 0.91 mg/dL (ref 0.4–1.2)
GFR calc Af Amer: >60 mL/min (ref >60)
GFR calc non Af Amer: >60 mL/min (ref >60)
Total Bilirubin: 0.5 mg/dL (ref 0.3–1.2)

## 2010-07-10 LAB — BLOOD GAS, ARTERIAL
Bicarbonate: 26.3 mEq/L — ABNORMAL HIGH (ref 20.0–24.0)
FIO2: 0.21 %
O2 Saturation: 98.1 %
pH, Arterial: 7.416 — ABNORMAL HIGH (ref 7.350–7.400)
pO2, Arterial: 102 mmHg — ABNORMAL HIGH (ref 80.0–100.0)

## 2010-07-10 LAB — URINALYSIS, ROUTINE W REFLEX MICROSCOPIC
Glucose, UA: NEGATIVE mg/dL
Specific Gravity, Urine: 1.012 (ref 1.005–1.030)
pH: 5.5 (ref 5.0–8.0)

## 2010-07-10 LAB — CBC
Hemoglobin: 12.9 g/dL (ref 12.0–15.0)
RBC: 4.15 MIL/uL (ref 3.87–5.11)

## 2010-07-10 LAB — ABO/RH: ABO/RH(D): O POS

## 2010-07-10 LAB — PROTIME-INR: Prothrombin Time: 13.6 seconds (ref 11.6–15.2)

## 2010-07-14 ENCOUNTER — Ambulatory Visit (INDEPENDENT_AMBULATORY_CARE_PROVIDER_SITE_OTHER): Payer: Managed Care, Other (non HMO) | Admitting: Thoracic Surgery (Cardiothoracic Vascular Surgery)

## 2010-07-14 DIAGNOSIS — I050 Rheumatic mitral stenosis: Secondary | ICD-10-CM

## 2010-07-14 DIAGNOSIS — I059 Rheumatic mitral valve disease, unspecified: Secondary | ICD-10-CM

## 2010-07-15 NOTE — Assessment & Plan Note (Signed)
OFFICE VISIT  Julia Garza, FARGO DOB:  February 16, 1961                                        July 14, 2010 CHART #:  [id number]  The patient returns for followup with plans to proceed with elective mitral valve replacement later this week.  She was originally seen in consultation on March 20, and a full history and physical exam were dictated at that time.  Since then, she has remained clinically stable. She did start her preoperative amiodarone last week and she has tolerated it well so far.  She also underwent CT angiogram of the chest, abdomen and pelvis.  This documented the absence of any significant aortoiliac occlusive disease.  Pulmonary function tests were performed July 10, 2010.  These demonstrate baseline FEV-1 measured 1.72 L or 82% predicted.  The forced vital capacity was 2.27 L or 87% predicted.  The FEF 25-75 was 1.39 L or 61% predicted.  Diffusion capacity was 67% predicted.  I again reviewed the indications, risks, and potential benefits of surgery with the patient and her family present here in the office today.  We discussed the high likelihood that her valve will need to be replaced and under the circumstances, we would plan to replace it using a mechanical prosthesis.  She understands that this will come with lifelong need for anticoagulation.  We spent in excess of 20 minutes reviewing the indications, risks and potential benefits of surgery.  She understands and accepts all associated risks and desires to proceed as described.  Salvatore Decent. Cornelius Moras, M.D. Electronically Signed  CHO/MEDQ  D:  07/14/2010  T:  07/15/2010  Job:  [id number]  cc:   Madolyn Frieze. Jens Som, MD, [facility] Sand[place] Craze, NP

## 2010-07-16 ENCOUNTER — Other Ambulatory Visit: Payer: Self-pay | Admitting: Thoracic Surgery (Cardiothoracic Vascular Surgery)

## 2010-07-16 ENCOUNTER — Inpatient Hospital Stay (HOSPITAL_COMMUNITY)
Admission: RE | Admit: 2010-07-16 | Discharge: 2010-07-23 | DRG: 220 | Disposition: A | Discharge status: Home / Self Care | Payer: Managed Care, Other (non HMO) | Source: Ambulatory Visit | Attending: Thoracic Surgery (Cardiothoracic Vascular Surgery) | Admitting: Thoracic Surgery (Cardiothoracic Vascular Surgery)

## 2010-07-16 ENCOUNTER — Inpatient Hospital Stay (HOSPITAL_COMMUNITY): Payer: Managed Care, Other (non HMO)

## 2010-07-16 DIAGNOSIS — E8779 Other fluid overload: Secondary | ICD-10-CM | POA: Diagnosis not present

## 2010-07-16 DIAGNOSIS — D62 Acute posthemorrhagic anemia: Secondary | ICD-10-CM | POA: Diagnosis not present

## 2010-07-16 DIAGNOSIS — I4891 Unspecified atrial fibrillation: Secondary | ICD-10-CM | POA: Diagnosis not present

## 2010-07-16 DIAGNOSIS — I059 Rheumatic mitral valve disease, unspecified: Secondary | ICD-10-CM

## 2010-07-16 DIAGNOSIS — D72829 Elevated white blood cell count, unspecified: Secondary | ICD-10-CM | POA: Diagnosis not present

## 2010-07-16 DIAGNOSIS — I052 Rheumatic mitral stenosis with insufficiency: Principal | ICD-10-CM | POA: Diagnosis present

## 2010-07-16 DIAGNOSIS — I10 Essential (primary) hypertension: Secondary | ICD-10-CM | POA: Diagnosis present

## 2010-07-16 DIAGNOSIS — I5042 Chronic combined systolic (congestive) and diastolic (congestive) heart failure: Secondary | ICD-10-CM | POA: Diagnosis present

## 2010-07-16 DIAGNOSIS — I509 Heart failure, unspecified: Secondary | ICD-10-CM | POA: Diagnosis present

## 2010-07-16 DIAGNOSIS — J45909 Unspecified asthma, uncomplicated: Secondary | ICD-10-CM | POA: Diagnosis present

## 2010-07-16 DIAGNOSIS — N390 Urinary tract infection, site not specified: Secondary | ICD-10-CM | POA: Diagnosis not present

## 2010-07-16 DIAGNOSIS — K219 Gastro-esophageal reflux disease without esophagitis: Secondary | ICD-10-CM | POA: Diagnosis present

## 2010-07-16 DIAGNOSIS — Z87891 Personal history of nicotine dependence: Secondary | ICD-10-CM

## 2010-07-16 DIAGNOSIS — Z79899 Other long term (current) drug therapy: Secondary | ICD-10-CM

## 2010-07-16 HISTORY — PX: MITRAL VALVE REPLACEMENT: SHX147

## 2010-07-16 LAB — POCT I-STAT 4, (NA,K, GLUC, HGB,HCT)
Glucose, Bld: 129 mg/dL — ABNORMAL HIGH (ref 70–99)
Glucose, Bld: 107 mg/dL — ABNORMAL HIGH (ref 70–99)
Glucose, Bld: 134 mg/dL — ABNORMAL HIGH (ref 70–99)
HCT: 30 % — ABNORMAL LOW (ref 36.0–46.0)
HCT: 26 % — ABNORMAL LOW (ref 36.0–46.0)
HCT: 23 % — ABNORMAL LOW (ref 36.0–46.0)
Hemoglobin: 10.2 g/dL — ABNORMAL LOW (ref 12.0–15.0)
Hemoglobin: 8.8 g/dL — ABNORMAL LOW (ref 12.0–15.0)
Potassium: 3.9 mEq/L (ref 3.5–5.1)
Potassium: 4.5 mEq/L (ref 3.5–5.1)
Sodium: 134 mEq/L — ABNORMAL LOW (ref 135–145)
Sodium: 138 mEq/L (ref 135–145)
Sodium: 139 mEq/L (ref 135–145)

## 2010-07-16 LAB — CBC
HCT: 35.8 % — ABNORMAL LOW (ref 36.0–46.0)
HCT: 35 % — ABNORMAL LOW (ref 36.0–46.0)
Hemoglobin: 12.4 g/dL (ref 12.0–15.0)
Hemoglobin: 12.3 g/dL (ref 12.0–15.0)
MCH: 31.1 pg (ref 26.0–34.0)
MCHC: 34.6 g/dL (ref 30.0–36.0)
MCV: 89.7 fL (ref 78.0–100.0)
MCV: 90.4 fL (ref 78.0–100.0)
WBC: 20.3 10*3/uL — ABNORMAL HIGH (ref 4.0–10.5)

## 2010-07-16 LAB — CREATININE, SERUM
Creatinine, Ser: 0.98 mg/dL (ref 0.4–1.2)
GFR calc non Af Amer: >60 mL/min (ref >60)

## 2010-07-16 LAB — HEMOGLOBIN AND HEMATOCRIT, BLOOD
HCT: 24.3 % — ABNORMAL LOW (ref 36.0–46.0)
Hemoglobin: 8.5 g/dL — ABNORMAL LOW (ref 12.0–15.0)

## 2010-07-16 LAB — POCT I-STAT, CHEM 8
BUN: 21 mg/dL (ref 6–23)
Chloride: 108 mEq/L (ref 96–112)
HCT: 36 % (ref 36.0–46.0)
Potassium: 4.5 mEq/L (ref 3.5–5.1)

## 2010-07-16 LAB — GLUCOSE, CAPILLARY
Glucose-Capillary: 95 mg/dL (ref 70–99)
Glucose-Capillary: 85 mg/dL (ref 70–99)

## 2010-07-16 LAB — POCT I-STAT 3, ART BLOOD GAS (G3+)
O2 Saturation: 100 %
O2 Saturation: 100 %
O2 Saturation: 98 %
Patient temperature: 34.5
TCO2: 26 mmol/L (ref 0–100)
TCO2: 26 mmol/L (ref 0–100)
pCO2 arterial: 38.7 mmHg (ref 35.0–45.0)
pCO2 arterial: 40.9 mmHg (ref 35.0–45.0)
pH, Arterial: 7.414 — ABNORMAL HIGH (ref 7.350–7.400)
pO2, Arterial: 314 mmHg — ABNORMAL HIGH (ref 80.0–100.0)
pO2, Arterial: 103 mmHg — ABNORMAL HIGH (ref 80.0–100.0)

## 2010-07-16 LAB — PROTIME-INR: INR: 1.53 — ABNORMAL HIGH (ref 0.00–1.49)

## 2010-07-16 LAB — PLATELET COUNT: Platelets: 233 10*3/uL (ref 150–400)

## 2010-07-16 LAB — POCT I-STAT GLUCOSE: Glucose, Bld: 95 mg/dL (ref 70–99)

## 2010-07-16 LAB — MAGNESIUM: Magnesium: 3.1 mg/dL — ABNORMAL HIGH (ref 1.5–2.5)

## 2010-07-17 ENCOUNTER — Inpatient Hospital Stay (HOSPITAL_COMMUNITY): Payer: Managed Care, Other (non HMO)

## 2010-07-17 DIAGNOSIS — E8779 Other fluid overload: Secondary | ICD-10-CM

## 2010-07-17 LAB — CBC
MCH: 30.6 pg (ref 26.0–34.0)
MCH: 31.6 pg (ref 26.0–34.0)
MCHC: 33.8 g/dL (ref 30.0–36.0)
MCV: 92.9 fL (ref 78.0–100.0)
Platelets: 188 10*3/uL (ref 150–400)
RBC: 3.5 MIL/uL — ABNORMAL LOW (ref 3.87–5.11)
RBC: 3.51 MIL/uL — ABNORMAL LOW (ref 3.87–5.11)
RDW: 13.8 % (ref 11.5–15.5)
RDW: 14.2 % (ref 11.5–15.5)
WBC: 18.2 10*3/uL — ABNORMAL HIGH (ref 4.0–10.5)
WBC: 20 10*3/uL — ABNORMAL HIGH (ref 4.0–10.5)

## 2010-07-17 LAB — TYPE AND SCREEN
ABO/RH(D): O POS
Antibody Screen: NEGATIVE
Unit division: 0

## 2010-07-17 LAB — GLUCOSE, CAPILLARY: Glucose-Capillary: 131 mg/dL — ABNORMAL HIGH (ref 70–99)

## 2010-07-17 LAB — POCT I-STAT 4, (NA,K, GLUC, HGB,HCT)
Glucose, Bld: 87 mg/dL (ref 70–99)
HCT: 33 % — ABNORMAL LOW (ref 36.0–46.0)
Potassium: 3.9 mEq/L (ref 3.5–5.1)

## 2010-07-17 LAB — CREATININE, SERUM: GFR calc Af Amer: 57 mL/min — ABNORMAL LOW (ref >60)

## 2010-07-17 LAB — BASIC METABOLIC PANEL
BUN: 14 mg/dL (ref 6–23)
CO2: 23 mEq/L (ref 19–32)
Calcium: 7.8 mg/dL — ABNORMAL LOW (ref 8.4–10.5)
GFR calc non Af Amer: >60 mL/min (ref >60)
Glucose, Bld: 125 mg/dL — ABNORMAL HIGH (ref 70–99)

## 2010-07-17 LAB — MAGNESIUM: Magnesium: 2.2 mg/dL (ref 1.5–2.5)

## 2010-07-17 NOTE — Op Note (Signed)
NAME:  Julia Garza, Julia Garza NO.:  [id number]  MEDICAL RECORD NO.:  [id number]           PATIENT TYPE:  I  LOCATION:  2314                         FACILITY:  [facility]  PHYSICIAN:  Salvatore Decent. Cornelius Moras, M.D. DATE OF BIRTH:  Feb 22, 1961  DATE OF PROCEDURE:  07/16/2010 DATE OF DISCHARGE:  07/10/2010                              OPERATIVE REPORT   PREOPERATIVE DIAGNOSIS:  Mitral stenosis and mitral regurgitation.  POSTOPERATIVE DIAGNOSIS:  Mitral stenosis and mitral regurgitation.  PROCEDURE:  Right miniature thoracotomy for mitral valve replacement (29- mm Carbo[employer] mechanical prosthesis).  SURGEON:  Salvatore Decent. 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 Echocardiograms confirmed the presence of normal left ventricular size and systolic function with severely diseased mitral valve with appearance consistent with underlying rheumatic disease with mild-to- moderate mitral stenosis and moderate mitral regurgitation. Catheterization confirms the presence of normal coronary artery anatomy with no significant coronary artery disease.  A full consultation note has been dictated previously.  The patient has been counseled at length regarding the indications, risks, and potential benefits of surgery. Alternative treatment strategies have been discussed.  The patient understands and accepts all potential associated risks and desires to proceed with surgery as described.  OPERATIVE FINDINGS: 1. Rheumatic mitral valve disease with moderate mitral stenosis and     moderate mitral regurgitation. 2. Functional type IIIA mitral regurgitation. 3. Mild aortic insufficiency. 4. Normal left ventricular systolic function.  OPERATIVE PROCEDURE IN  DETAIL:  The patient was brought to the operating room on the above-mentioned date and central monitoring was established by the anesthesia team under the care and direction of Dr. Kipp Brood. Specifically, a Swan-Ganz catheter was placed through the left internal jugular approach.  A radial arterial line was placed.  Intravenous antibiotics were administered.  The patient was placed in the supine position on the operating table.  General endotracheal anesthesia was induced uneventfully.  The patient was initially intubated with a dual- lumen endotracheal tube.  A Foley catheter was placed.  The patient was positioned with a soft roll behind the right scapula and the neck, gently extended and turned towards the left.  The patient's right neck, chest, abdomen, both groins, and both lower extremities are prepared and draped in sterile manner.  Baseline transesophageal echocardiogram was performed by Dr. Noreene Larsson. This confirms the presence of rheumatic mitral valve disease with moderate mitral stenosis and moderate mitral regurgitation.  There was normal left ventricular size and function.  There was left atrial enlargement.  The aortic valve has mild aortic insufficiency with a central jet.  The aortic valve itself was tricuspid and the leaflets move well.  There was trivial tricuspid regurgitation and the tricuspid annulus measured 3.5 cm in diameter.  No other abnormalities were noted.  A small incision was made in the right inguinal crease and the anterior surface of the right common femoral artery and right common femoral vein  are dissected through the incision.  Single lung ventilation was begun and a right miniature anterolateral thoracotomy incision was performed with the incision placed superior to and just lateral to the right nipple.  The pectoralis major muscle was retracted medially and completely preserved.  The right pleural space was entered through the third intercostal  space.  A pledgeted suture was placed in the dome of the right hemidiaphragm and retracted inferiorly to facilitate exposure. After placement of a soft tissue retractor, a longitudinal incision was made in the pericardium 3 cm anterior to the phrenic nerve.  Silk traction sutures were placed on either side of the pericardium for retraction.  The patient was placed in Trendelenburg position.  The right internal jugular vein was cannulated with a Seldinger technique and a flexible guidewire was advanced into the right atrium.  The patient is now heparinized systemically.  A pursestring suture was placed on the anterior surface of the right common femoral vein and two concentric pursestring sutures were placed on the anterior surface of the right common femoral artery.  The right common femoral vein was cannulated with a Seldinger technique and a long flexible guidewire was advanced up through the right atrium until the guidewire extends into the superior vena cava using transesophageal echocardiogram for guidance.  The femoral vein was dilated with serial dilators and cannulated with a 22- French long femoral venous cannula.  The femoral artery was cannulated with a Seldinger technique and a guidewire was advanced until it can be appreciated intraluminally in the descending thoracic aorta.  The femoral artery was dilated with serial dilators and cannulated with a 16- French femoral arterial cannula.  The right internal jugular vein was dilated with serial dilators and cannulated with a 14-French pediatric femoral venous cannula.  Cardiopulmonary bypass was begun.  Vacuum-assist venous drainage was utilized.  Venous drainage and exposure was excellent.  The incision in the pericardium was extended in both directions.  Antegrade cardioplegic cannula was placed directly in the ascending aorta.  A retrograde cardioplegic cannula was placed through the right atrium in the coronary sinus using  transesophageal echocardiogram for guidance.  Systemic cooling was begun, and the patient was cooled to 20 degrees systemic temperature.  The aortic cross-clamp was applied and cold blood cardioplegia was delivered initially in antegrade fashion through the aortic root.  The initial cardioplegic arrest was rapid with early diastolic arrest.  Supplemental cardioplegia was administered retrograde through the coronary sinus catheter.  Repeat doses of cardioplegia were administered every 20-30 minutes throughout the entire cross-clamp portion of the operation to maintain completely flat electrocardiogram. The right pleural space was insufflated continuously with carbon dioxide gas through the posterior port placed inferiorly.  A left atriotomy incision was performed posteriorly and continued partway across the back wall of the left atrium after opening the oblique sinus inferiorly.  A retractor blade was placed into the left atrium and fixed to a sidearm, placed through a stab incision just to the right side of the sternum through the third intercostal space. Exposure of the mitral valve was excellent.  The mitral valve had a classical appearance of rheumatic mitral valve disease.  There was moderate mitral stenosis with moderate mitral regurgitation.  Both the anterior and posterior leaflets were essentially frozen with classical fishmouth appearance and severe thickening and foreshortening of the subvalvular apparatus.  All of the primary chords to the anterior leaflet are completely fused such that preservation of portions of the subvalvular apparatus from the anterior leaflet was not  possible. Therefore, the entire anterior leaflet and its associated cords were resected sharply.  The posterior leaflet of the valve was preserved and split in midline to facilitate mitral valve replacement.  The mitral annulus was sized to accept 29-mm mechanical prosthesis.  Mitral valve replacement was  performed using interrupted 2-0 Ethibond horizontal mattress pledgeted sutures with pledgets in the supraannular position.  A Sorin [employer] OptiForm mechanical mitral valve prosthesis (size 29 mm, catalog number [id number], serial number [id number]) was secured in place uneventfully.  The valve was tested to make sure that the leaflets open and close freely without any possibility of impingement on the preserved portions of the subvalvular apparatus. Rewarming was begun.  A sump drain was placed across the mitral valve to serve as a left ventricular vent.  The left atriotomy incision was closed posteriorly using a two-layer closure of running 3-0 Prolene suture.  One final dose of warm hotshot cardioplegia was administered.  The lungs were ventilated and the heart allowed to fill, after which time the aortic cross-clamp was removed after total cross-clamp time of 92 minutes.  The heart began to beat spontaneously without need for cardioversion. The retrograde cardioplegic cannula was removed.  Epicardial pacing wire was fixed to the right ventricular free wall into the right atrial appendage.  The lungs were ventilated and the heart allowed to fill briefly, after which time the left ventricular vent was removed.  The antegrade cardioplegic cannula was removed.  The patient was rewarmed to 37 degrees centigrade temperature.  The patient was weaned from cardiopulmonary bypass without difficulty.  The patient's rhythm at separation from bypass was junctional rhythm.  AV pacing was employed.  Total cardiopulmonary bypass time for the operation was 147 minutes.  No inotropic support was required.  Followup transesophageal echocardiogram performed by Dr. Noreene Larsson after separation from bypass demonstrates normal functioning mechanical mitral valve prosthesis.  There are appropriate washing jets of regurgitation across the leaflets, but there is no sign of any perivalvular leak whatsoever.   Left ventricular function appears normal.  No other abnormalities are noted.  Protamine was administered to reverse the anticoagulation.  The femoral arterial and venous cannulas were removed uneventfully.  There was a palpable pulse in the distal right femoral artery.  The right internal jugular cannula was removed and manual pressure was held on the right neck for 30 minutes.  Single-lung ventilation was begun.  The left atriotomy incision was inspected for hemostasis.  The pericardial space was drained with a 28- French Bard chest tube placed through the anterior port incision.  The pericardium was reapproximated laterally with a patch of core matrix bovine submucosal tissue patch.  Intermittently, dual-lung ventilation was reinstituted due to tendency for oxygen saturations to drift down on single-lung ventilation.  The right groin incision was irrigated with saline solution and closed in multiple layers in routine fashion.  There was some bleeding noted in the chest that appears to be from the intercostal artery and vein that must have been lacerated where pacing wires were exited through the chest wall medially.  This was controlled with a single silk suture.  The right pleural space was irrigated with saline solution and inspected for hemostasis.  The On-Q continuous pain management system was utilized to facilitate postoperative pain control. One 5-inch catheter supplied with the On-Q kit was tunneled through the subcutaneous tissues to the posterior port incision and then tunneled through the intercostal space into the subpleural space posteriorly to cover the second through the sixth intercostal  nerve roots.  The catheter was flushed with 5 mL of 0.5% bupivacaine solution and ultimately connected to continuous infusion pump.  The right pleural space was drained with a 28-French Bard chest tube placed through the posterior port incision.  The thoracotomy was closed in  multiple layers in routine fashion.  Skin incision closed with subcuticular skin closure.  The patient tolerated the procedure well.  Chest tubes were fixed to closed suction drainage device.  The patient was reintubated with a single-lumen endotracheal tube and transported to the surgical intensive care unit in stable condition.  There were no intraoperative complications.  All sponge, instrument, and needle counts were verified correct at completion of the operation.     Salvatore Decent. Cornelius Moras, M.D.     CHO/MEDQ  D:  07/16/2010  T:  07/17/2010  Job:  [id number]  cc:   Madolyn Frieze. Jens Som, MD, [facility] Sandford Craze, NP  Electronically Signed by Tressie Stalker M.D. on 07/17/2010 07:48:24 AM

## 2010-07-18 ENCOUNTER — Inpatient Hospital Stay (HOSPITAL_COMMUNITY): Payer: Managed Care, Other (non HMO)

## 2010-07-18 LAB — BASIC METABOLIC PANEL
CO2: 25 mEq/L (ref 19–32)
Chloride: 106 mEq/L (ref 96–112)
GFR calc Af Amer: >60 mL/min (ref >60)
Potassium: 3.9 mEq/L (ref 3.5–5.1)
Sodium: 137 mEq/L (ref 135–145)

## 2010-07-18 LAB — CBC
Hemoglobin: 10.1 g/dL — ABNORMAL LOW (ref 12.0–15.0)
MCH: 31.2 pg (ref 26.0–34.0)
Platelets: 146 10*3/uL — ABNORMAL LOW (ref 150–400)
RBC: 3.24 MIL/uL — ABNORMAL LOW (ref 3.87–5.11)
WBC: 20.1 10*3/uL — ABNORMAL HIGH (ref 4.0–10.5)

## 2010-07-18 LAB — URINE MICROSCOPIC-ADD ON

## 2010-07-18 LAB — GLUCOSE, CAPILLARY
Glucose-Capillary: 149 mg/dL — ABNORMAL HIGH (ref 70–99)
Glucose-Capillary: 155 mg/dL — ABNORMAL HIGH (ref 70–99)
Glucose-Capillary: 144 mg/dL — ABNORMAL HIGH (ref 70–99)

## 2010-07-18 LAB — URINALYSIS, ROUTINE W REFLEX MICROSCOPIC
Glucose, UA: NEGATIVE mg/dL
Ketones, ur: 15 mg/dL — AB
Protein, ur: 30 mg/dL — AB

## 2010-07-18 LAB — PROTIME-INR: Prothrombin Time: 16.7 seconds — ABNORMAL HIGH (ref 11.6–15.2)

## 2010-07-19 LAB — CBC
MCV: 93 fL (ref 78.0–100.0)
Platelets: 158 10*3/uL (ref 150–400)
RDW: 14.1 % (ref 11.5–15.5)
WBC: 23.2 10*3/uL — ABNORMAL HIGH (ref 4.0–10.5)

## 2010-07-20 ENCOUNTER — Inpatient Hospital Stay (HOSPITAL_COMMUNITY): Payer: Managed Care, Other (non HMO)

## 2010-07-20 LAB — CBC
MCH: 31.5 pg (ref 26.0–34.0)
MCHC: 34 g/dL (ref 30.0–36.0)
MCV: 92.5 fL (ref 78.0–100.0)
Platelets: 207 10*3/uL (ref 150–400)
RDW: 14.1 % (ref 11.5–15.5)
WBC: 18.6 10*3/uL — ABNORMAL HIGH (ref 4.0–10.5)

## 2010-07-20 LAB — URINE CULTURE

## 2010-07-21 ENCOUNTER — Inpatient Hospital Stay (HOSPITAL_COMMUNITY): Payer: Managed Care, Other (non HMO)

## 2010-07-21 LAB — CBC
Hemoglobin: 8.5 g/dL — ABNORMAL LOW (ref 12.0–15.0)
RBC: 2.73 MIL/uL — ABNORMAL LOW (ref 3.87–5.11)
WBC: 13.3 10*3/uL — ABNORMAL HIGH (ref 4.0–10.5)

## 2010-07-21 LAB — PROTIME-INR
INR: 1.53 — ABNORMAL HIGH (ref 0.00–1.49)
Prothrombin Time: 18.6 seconds — ABNORMAL HIGH (ref 11.6–15.2)

## 2010-07-21 LAB — BASIC METABOLIC PANEL
Calcium: 8.7 mg/dL (ref 8.4–10.5)
GFR calc Af Amer: >60 mL/min (ref >60)
GFR calc non Af Amer: >60 mL/min (ref >60)
Potassium: 4.3 mEq/L (ref 3.5–5.1)
Sodium: 134 mEq/L — ABNORMAL LOW (ref 135–145)

## 2010-07-22 LAB — CBC
HCT: 24.9 % — ABNORMAL LOW (ref 36.0–46.0)
Hemoglobin: 8.3 g/dL — ABNORMAL LOW (ref 12.0–15.0)
WBC: 11.4 10*3/uL — ABNORMAL HIGH (ref 4.0–10.5)

## 2010-07-22 LAB — BASIC METABOLIC PANEL
BUN: 12 mg/dL (ref 6–23)
Calcium: 8.5 mg/dL (ref 8.4–10.5)
Chloride: 102 mEq/L (ref 96–112)
GFR calc Af Amer: >60 mL/min (ref >60)
Glucose, Bld: 123 mg/dL — ABNORMAL HIGH (ref 70–99)
Potassium: 3.9 mEq/L (ref 3.5–5.1)

## 2010-07-22 LAB — PROTIME-INR
INR: 1.94 — ABNORMAL HIGH (ref 0.00–1.49)
Prothrombin Time: 22.3 seconds — ABNORMAL HIGH (ref 11.6–15.2)

## 2010-07-23 NOTE — Op Note (Signed)
NAME:  Julia Garza, Julia Garza NO.:  [id number]  MEDICAL RECORD NO.:  [id number]           PATIENT TYPE:  I  LOCATION:  2314                         FACILITY:  [facility]  PHYSICIAN:  Guadalupe Maple, M.D.  DATE OF BIRTH:  07-Jul-1960  DATE OF PROCEDURE:  07/16/2010 DATE OF DISCHARGE:  07/10/2010                              OPERATIVE REPORT   PROCEDURE:  Intraoperative transesophageal echocardiography.  Ms. Julia Garza is a 50 year old African American female with a history of rheumatic mitral valve disease with recurrent episodes of congestive heart failure.  She was found to have both mitral regurgitation and mitral stenosis and is now scheduled to undergo mitral valve replacement by Dr. Tressie Stalker.  Intraoperative transesophageal echocardiography was requested to evaluate the mitral valve and to assist with the mitral valve replacement as well as to determine if any other valvular pathology was present and to serve as a monitor for intraoperative volume status.  The patient was brought to the operating room at [facility] and general anesthesia was induced without difficulty.  Following endotracheal intubation and orogastric suctioning, the transesophageal echocardiography probe was then inserted into the esophagus without difficulty.  IMPRESSION:  Prebypass findings: 1. Mitral valve.  There was obvious mitral stenosis with restriction to     opening of the anterior leaflet with diastolic bowing of the     anterior leaflet.  There was flow acceleration with mitral inflow.     There was moderate calcification of the mitral annulus and no     obvious calcification of the subvalvular apparatus that could be     appreciated.  The mitral inflow continuous wave Doppler revealed a     mean gradient of 4 mmHg and a mitral valve area of 3.2 cm2 using     the pressure half-time method.  Three-dimensional views showed that     there was reduced opening of the mitral  valve orifice.  There was 1     to 2 plus mitral insufficiency that could be appreciated.  However,     the patient was hemodynamically unloaded.  There was no prolapse or     fluttering of the leaflets. 2. Aortic valve.  The aortic valve was trileaflet and appeared to open     normally.  The leaflets were mildly thickened and there was 1+     aortic insufficiency. 3. Left ventricle.  The left ventricular cavity was mildly dilated,     but there was good contractility in all segments interrogated.     Ejection fraction was estimated 55% to 60%.  Left ventricular end-     diastolic diameter measured 5.55 cm.  Left ventricular end-systolic     diameter measured 4.4 cm.  Left ventricular wall thickness measured     1.05 to 1.1 cm and end diastole at the mid papillary level in the     short axis.  There was no thrombus noted in the left ventricular     apex. 4. Right ventricle.  The right ventricular size appeared to be within     normal limits.  There was some  evidence of right ventricular     hypertrophy.  There was normal contractility of the right ventricular free wall. 5. Tricuspid valve.  The tricuspid annulus did not appear dilated and     there was trace tricuspid insufficiency.  Tricuspid annulus     measured 3.57 in the 4-chamber view and 3.24 cm in the LV inflow     outflow view. 6. Interatrial septum.  The interatrial septum was intact without     evidence of patent foramen ovale or atrial septal defect by color     Doppler or bubble study. 7. Left atrium.  The left atrial cavity was enlarged and measured 5.5     cm in the mediolateral dimension in the 4-chamber view.  The     superior-inferior dimension measured 4.2 cm and the anterior-     posterior dimension in the aortic valve long axis view measured     5.77 cm.  There was no thrombus noted in the left atrial cavity or     left atrial appendage. 8. Ascending aorta.  The ascending aorta showed no significant      atheromatous disease and was not aneurysmal with a well-defined     aortic root inside tubular ridge. 9. Descending aorta.  The descending aorta was free of atheromatous     disease and measured 1.4 cm in diameter.  Postbypass findings: 1. Mitral valve.  There was a bileaflet mechanical prosthesis in the     mitral position.  The leaflets opened normally.  There were the     usual washing jets of mitral insufficiency seen with a bileaflet     mechanical valve.  Continuous wave Doppler interrogation of the     mitral inflow revealed a mean gradient of 3 mmHg.  There was no     perivalvular insufficiency with multiple views of the mitral valve     sewing ring. 2. Aortic valve.  The aortic valve was unchanged from the prebypass     study.  There was 1+ aortic insufficiency.  The leaflets were     mildly dilated, but opened normally. 3. Left ventricle.  There was some degree of ventricular dyssynchrony     due to ventricular pacing, but all segments appeared to be     contracting adequately.  Ejection fraction was estimated at 55% to     60%. 4. Right ventricle.  The right ventricular function appeared to be     within normal limits with good contractility of the right     ventricular free wall.  There did appear to be some degree of right     ventricular hypertrophy which is unchanged from the prebypass     study. 5. Tricuspid valve.  There was trace tricuspid insufficiency which     appeared unchanged from the prebypass study.          ______________________________ Guadalupe Maple, M.D.     DCJ/MEDQ  D:  07/16/2010  T:  07/17/2010  Job:  [id number]  Electronically Signed by Kipp Brood M.D. on 07/23/2010 09:42:27 AM

## 2010-07-24 ENCOUNTER — Encounter: Payer: Self-pay | Admitting: Cardiology

## 2010-07-24 DIAGNOSIS — I052 Rheumatic mitral stenosis with insufficiency: Secondary | ICD-10-CM

## 2010-07-24 DIAGNOSIS — Z954 Presence of other heart-valve replacement: Secondary | ICD-10-CM | POA: Insufficient documentation

## 2010-07-24 DIAGNOSIS — Z7901 Long term (current) use of anticoagulants: Secondary | ICD-10-CM | POA: Insufficient documentation

## 2010-07-25 ENCOUNTER — Ambulatory Visit (INDEPENDENT_AMBULATORY_CARE_PROVIDER_SITE_OTHER): Payer: Managed Care, Other (non HMO) | Admitting: *Deleted

## 2010-07-25 DIAGNOSIS — Z954 Presence of other heart-valve replacement: Secondary | ICD-10-CM

## 2010-07-25 DIAGNOSIS — Z7901 Long term (current) use of anticoagulants: Secondary | ICD-10-CM

## 2010-07-25 DIAGNOSIS — I052 Rheumatic mitral stenosis with insufficiency: Secondary | ICD-10-CM

## 2010-07-25 LAB — POCT INR: INR: 2.8

## 2010-07-25 NOTE — Patient Instructions (Signed)
Pt was in hospital and given: 4/19 5mg , 4/20 INR 1.33, 5mg , 4/21INR 1.53 5mg , 4/22 INR 1.50 7.5mg , 4/23 INR 1.53 7.5mg , 4/24 INR 1.94 7.5mg , 4/25 INR 2.1 discharged on 5mg  daily.

## 2010-07-28 ENCOUNTER — Encounter (INDEPENDENT_AMBULATORY_CARE_PROVIDER_SITE_OTHER): Payer: Self-pay | Admitting: Thoracic Surgery (Cardiothoracic Vascular Surgery)

## 2010-07-28 ENCOUNTER — Other Ambulatory Visit: Payer: Self-pay | Admitting: Thoracic Surgery (Cardiothoracic Vascular Surgery)

## 2010-07-28 ENCOUNTER — Ambulatory Visit
Admission: RE | Admit: 2010-07-28 | Discharge: 2010-07-28 | Disposition: A | Discharge status: Home / Self Care | Payer: Managed Care, Other (non HMO) | Source: Ambulatory Visit | Attending: Thoracic Surgery (Cardiothoracic Vascular Surgery) | Admitting: Thoracic Surgery (Cardiothoracic Vascular Surgery)

## 2010-07-28 ENCOUNTER — Telehealth: Payer: Self-pay

## 2010-07-28 DIAGNOSIS — I050 Rheumatic mitral stenosis: Secondary | ICD-10-CM

## 2010-07-28 DIAGNOSIS — I359 Nonrheumatic aortic valve disorder, unspecified: Secondary | ICD-10-CM

## 2010-07-28 DIAGNOSIS — I059 Rheumatic mitral valve disease, unspecified: Secondary | ICD-10-CM

## 2010-07-28 NOTE — Telephone Encounter (Signed)
Pt saw Dr Cornelius Moras post hospital f/u today in office.  He gave her rx for Avelox 400mg  qd x 10 days.  Updated medication list stopped Tramadol, added Benadryl qhs prn, and Tylenol prn.  Pt had been instructed to take 5mg  daily except 7.5mg  on Tuesdays and Saturdays.  Instructed pt to take 5mg  daily and keep recheck appt on Friday 08/01/10.

## 2010-07-28 NOTE — Assessment & Plan Note (Signed)
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  Her postoperative recovery has been uncomplicated.  Since hospital discharge, she has been seen at the [facility] and her Coumadin dose has been monitored and adjusted accordingly.  She returns to our office for routine followup today.  She states that overall she is doing well.  She still has soreness in her right chest and takes pain medication accordingly.  For the most part, she has been using Tylenol, but she still uses oxycodone when the pain seems to be more pronounced.  Over the last few days, she has also had increased productive cough associated with low-grade fever.  She states that initially she was coughing up some yellow sputum and now seems be more white.  She has had a low-grade fever to as high as 101 degrees this morning and associated with this she has had some sweats.  Her appetite is good and she otherwise feels well.  The remainder of her review of systems is unremarkable.  PHYSICAL EXAMINATION:  A well-appearing female, blood pressure 123/76, pulse 90 and regular, oxygen saturation 97% on room air, temperature 98.9 degrees Fahrenheit.  Examination of the chest reveals a minithoracotomy incision that is healing nicely.  There is no surrounding cellulitis or fluctuance on palpation of the right breast. Chest tube sutures are removed.  Auscultation of the chest reveals fairly clear breath sounds which are symmetrical bilaterally.  No wheezes, rales, or rhonchi are noted.  Cardiovascular exam includes regular rate and rhythm with mechanical heart sounds.  The abdomen is soft and nontender.  The right groin incision is  healing nicely.  There is no fluctuance or tenderness in the groin at all.  There is no lower extremity edema.  The remainder of her physical exam is unremarkable.  DIAGNOSTIC TEST:  Chest x-ray performed at the [facility] is reviewed.  This reveals clear lung fields bilaterally with very slight haziness at the right lung base which actually is improved in comparison with the last x-ray prior to hospital discharge.  There are no pleural effusions.  No other abnormalities are noted.  IMPRESSION:  Satisfactory progress following recent mitral valve replacement.  The patient does have a productive cough and low-grade fever.  There is very slight hazy opacity at the right lung base on chest x-ray, although this actually looks improved in comparison with the last x-ray prior to hospital discharge.  PLAN:  I have given the patient prescription for Avelox to take for a 10- day course to treat presumed tracheobronchitis.  She will stop by the [facility] to let them know of this new medication and advice them in case of any potential interaction with her Coumadin dosing.  We will plan to see her back in 3 weeks with a repeat chest x- ray.  She will call and return sooner if fevers become higher or if she develops any further problems.  Salvatore Decent. Cornelius Moras, M.D. Electronically Signed  CHO/MEDQ  D:  07/28/2010  T:  07/28/2010  Job:  [id number]  cc:  Madolyn Frieze Jens Som, MD, [facility] Sandford Craze, NP

## 2010-08-01 ENCOUNTER — Other Ambulatory Visit: Payer: Self-pay | Admitting: Thoracic Surgery (Cardiothoracic Vascular Surgery)

## 2010-08-01 ENCOUNTER — Ambulatory Visit (INDEPENDENT_AMBULATORY_CARE_PROVIDER_SITE_OTHER): Payer: Managed Care, Other (non HMO) | Admitting: *Deleted

## 2010-08-01 ENCOUNTER — Encounter (INDEPENDENT_AMBULATORY_CARE_PROVIDER_SITE_OTHER): Payer: Self-pay | Admitting: Thoracic Surgery (Cardiothoracic Vascular Surgery)

## 2010-08-01 DIAGNOSIS — I050 Rheumatic mitral stenosis: Secondary | ICD-10-CM

## 2010-08-01 DIAGNOSIS — Z954 Presence of other heart-valve replacement: Secondary | ICD-10-CM

## 2010-08-01 DIAGNOSIS — I059 Rheumatic mitral valve disease, unspecified: Secondary | ICD-10-CM

## 2010-08-01 DIAGNOSIS — I052 Rheumatic mitral stenosis with insufficiency: Secondary | ICD-10-CM

## 2010-08-01 DIAGNOSIS — R509 Fever, unspecified: Secondary | ICD-10-CM

## 2010-08-01 DIAGNOSIS — Z7901 Long term (current) use of anticoagulants: Secondary | ICD-10-CM

## 2010-08-01 LAB — POCT INR: INR: 3.7

## 2010-08-01 NOTE — Assessment & Plan Note (Signed)
OFFICE VISIT  Julia Garza, Julia Garza DOB:  09/30/1960                                        Aug 01, 2010 CHART #:  [id number]  HISTORY OF PRESENT ILLNESS:  The patient returns as an unscheduled visit today to the office because of fever.  She was last seen here in the office on this past Monday, July 28, 2010.  She states that since then she really has not changed, although her cough has improved.  She notes that she still keeps having fevers at night.  She has been having temperature usually at night after she goes to bed typically in the middle of the night, and they have been as high as 101-102 and last night she says she believes she was up to 103 degrees.  Associated with this, she gets diaphoretic.  She feels a little bit weak, but otherwise no significant problems.  She has not had shortness of breath.  Her cough has actually cleared up and now she is only coughing a tiny amount occasionally.  Her bowel function is regular and she has been eating. She has not had any tachy palpitations.  She otherwise feels well and remainder of her review of system is unchanged.  PHYSICAL EXAMINATION:  A well-appearing female with blood pressure 111/72, pulse 80 and regular, oxygen saturation 98% on room air, temperature 98.5 degrees Fahrenheit.  HEENT exam is unrevealing. Examination of the chest reveals a minithoracotomy incision that is healing quite nicely.  On palpation of the chest wall, there is no tenderness or fluctuance to suggest any sort of soft tissue infection. Chest tube incisions are also healing well.  Auscultation reveals clear breath sounds that are symmetrical bilaterally.  No wheezes, rales, or rhonchi noted.  Cardiovascular exam is notable for regular rate and rhythm with mechanical heart sounds.  No murmurs, rubs, or gallops are noted.  The abdomen is soft and nontender.  The right groin incision is healing nicely.  There is no tenderness on palpation  of the right groin to suggest any soft tissue infection.  There is no lower extremity edema.  The remainder of her physical exam is unremarkable.  IMPRESSION:  Fever of unknown origin.  The patient looks remarkably good overall, but I am concerned by her reported continued fevers that seemed to be primarily nocturnal.  It is possible that this could be drug reactions to amiodarone.  The possibility that she could have endocarditis must be considered.  There is certainly no sign of any obvious wound infection.  Her chest x-ray this past Monday looked remarkably clear and her breath sounds are clear on exam and she does not have a productive cough.  PLAN:  I have discussed options with the patient including admitting her to the hospital for further evaluation versus continued outpatient evaluation.  She does not wish to come back to the hospital at this time and she reports that overall, she really does not feel bad at all. Under the circumstances, we will plan to stop her amiodarone and stop her Avelox.  We will send her for three sets of blood cultures and a sedimentation rate.  We will also check a complete blood count and comprehensive metabolic panel.  We will have her return to the office on Monday and get her with follow up chest x-ray at that time.  If her  fevers persist after stopping the amiodarone, I think we will may need to consider putting her in the hospital for further management until this has been sorted out.  Salvatore Decent. Cornelius Moras, M.D. Electronically Signed  CHO/MEDQ  D:  08/01/2010  T:  08/01/2010  Job:  [id number]  cc:   Madolyn Frieze. Jens Som, MD, [facility] Sandford Craze, NP

## 2010-08-04 ENCOUNTER — Inpatient Hospital Stay (HOSPITAL_COMMUNITY)
Admission: AD | Admit: 2010-08-04 | Discharge: 2010-08-07 | DRG: 864 | Disposition: A | Discharge status: Home / Self Care | Payer: Managed Care, Other (non HMO) | Source: Ambulatory Visit | Attending: Thoracic Surgery (Cardiothoracic Vascular Surgery) | Admitting: Thoracic Surgery (Cardiothoracic Vascular Surgery)

## 2010-08-04 ENCOUNTER — Encounter (INDEPENDENT_AMBULATORY_CARE_PROVIDER_SITE_OTHER): Payer: Self-pay | Admitting: Thoracic Surgery (Cardiothoracic Vascular Surgery)

## 2010-08-04 ENCOUNTER — Ambulatory Visit
Admission: RE | Admit: 2010-08-04 | Discharge: 2010-08-04 | Disposition: A | Discharge status: Home / Self Care | Payer: Managed Care, Other (non HMO) | Source: Ambulatory Visit | Attending: Thoracic Surgery (Cardiothoracic Vascular Surgery) | Admitting: Thoracic Surgery (Cardiothoracic Vascular Surgery)

## 2010-08-04 ENCOUNTER — Inpatient Hospital Stay (HOSPITAL_COMMUNITY): Payer: Managed Care, Other (non HMO)

## 2010-08-04 DIAGNOSIS — J45909 Unspecified asthma, uncomplicated: Secondary | ICD-10-CM | POA: Diagnosis present

## 2010-08-04 DIAGNOSIS — I5042 Chronic combined systolic (congestive) and diastolic (congestive) heart failure: Secondary | ICD-10-CM | POA: Diagnosis present

## 2010-08-04 DIAGNOSIS — I10 Essential (primary) hypertension: Secondary | ICD-10-CM | POA: Diagnosis present

## 2010-08-04 DIAGNOSIS — Z954 Presence of other heart-valve replacement: Secondary | ICD-10-CM

## 2010-08-04 DIAGNOSIS — R509 Fever, unspecified: Secondary | ICD-10-CM

## 2010-08-04 DIAGNOSIS — I059 Rheumatic mitral valve disease, unspecified: Secondary | ICD-10-CM

## 2010-08-04 DIAGNOSIS — Z87891 Personal history of nicotine dependence: Secondary | ICD-10-CM

## 2010-08-04 DIAGNOSIS — Z7901 Long term (current) use of anticoagulants: Secondary | ICD-10-CM

## 2010-08-04 DIAGNOSIS — Z79899 Other long term (current) drug therapy: Secondary | ICD-10-CM

## 2010-08-04 DIAGNOSIS — Z7982 Long term (current) use of aspirin: Secondary | ICD-10-CM

## 2010-08-04 DIAGNOSIS — K219 Gastro-esophageal reflux disease without esophagitis: Secondary | ICD-10-CM | POA: Diagnosis present

## 2010-08-04 DIAGNOSIS — I050 Rheumatic mitral stenosis: Secondary | ICD-10-CM

## 2010-08-04 DIAGNOSIS — I4891 Unspecified atrial fibrillation: Secondary | ICD-10-CM | POA: Diagnosis present

## 2010-08-04 DIAGNOSIS — I319 Disease of pericardium, unspecified: Secondary | ICD-10-CM | POA: Diagnosis present

## 2010-08-04 DIAGNOSIS — I509 Heart failure, unspecified: Secondary | ICD-10-CM | POA: Diagnosis present

## 2010-08-04 LAB — CBC
HCT: 24.6 % — ABNORMAL LOW (ref 36.0–46.0)
Hemoglobin: 8 g/dL — ABNORMAL LOW (ref 12.0–15.0)
MCHC: 32.5 g/dL (ref 30.0–36.0)
RBC: 2.63 MIL/uL — ABNORMAL LOW (ref 3.87–5.11)

## 2010-08-04 LAB — COMPREHENSIVE METABOLIC PANEL
ALT: 26 U/L (ref 0–35)
AST: 23 U/L (ref 0–37)
Albumin: 2.6 g/dL — ABNORMAL LOW (ref 3.5–5.2)
Calcium: 9.3 mg/dL (ref 8.4–10.5)
Creatinine, Ser: 0.92 mg/dL (ref 0.4–1.2)
GFR calc Af Amer: >60 mL/min (ref >60)
GFR calc non Af Amer: >60 mL/min (ref >60)
Sodium: 133 mEq/L — ABNORMAL LOW (ref 135–145)
Total Protein: 7.3 g/dL (ref 6.0–8.3)

## 2010-08-04 LAB — URINALYSIS, MICROSCOPIC ONLY
Leukocytes, UA: NEGATIVE
Nitrite: NEGATIVE
Specific Gravity, Urine: 1.002 — ABNORMAL LOW (ref 1.005–1.030)
Urobilinogen, UA: 1 mg/dL (ref 0.0–1.0)
pH: 7 (ref 5.0–8.0)

## 2010-08-04 LAB — PROTIME-INR
INR: 2.55 — ABNORMAL HIGH (ref 0.00–1.49)
Prothrombin Time: 27.5 seconds — ABNORMAL HIGH (ref 11.6–15.2)

## 2010-08-04 MED ORDER — IOHEXOL 300 MG/ML  SOLN
100.0000 mL | Freq: Once | INTRAMUSCULAR | Status: AC | PRN
  Administered 2010-08-04: 100 mL via INTRAVENOUS

## 2010-08-04 NOTE — Discharge Summary (Signed)
NAME:  Julia Garza, ROZZELL NO.:  [id number]  MEDICAL RECORD NO.:  [id number]           PATIENT TYPE:  I  LOCATION:  2037                         FACILITY:  [facility]  PHYSICIAN:  Salvatore Decent. Cornelius Moras, M.D. DATE OF BIRTH:  1960-12-16  DATE OF ADMISSION:  07/16/2010 DATE OF DISCHARGE:                              DISCHARGE SUMMARY   PRIMARY ADMITTING DIAGNOSES: 1. Mitral stenosis. 2. Mitral regurgitation.  ADDITIONAL/DISCHARGE DIAGNOSES: 1. Mitral stenosis. 2. Mitral regurgitation. 3. History of acute on chronic systolic and diastolic congestive heart     failure. 4. Postoperative atrial fibrillation. 5. Postoperative leukocytosis with presumed urinary tract infection. 6. Hypertension. 7. Gastroesophageal reflux disease. 8. Asthma. 9. Allergic rhinitis. 10.Remote history of tobacco abuse. 11.Postoperative acute blood loss anemia.  PROCEDURES PERFORMED:  Right mini thoracotomy for mitral valve replacement with 29-mm [employer] mechanical prosthesis.  HISTORY:  The patient is a 50 year old female with a known history of rheumatic mitral valve disease.  She has recently had recurrent exacerbations of acute on chronic systolic and diastolic congestive heart failure.  Echocardiogram confirmed the presence of normal left ventricular size and systolic function with severely disease, mitral valve with an appearance consistent with underlying rheumatic disease with mild-to-moderate mitral stenosis and moderate mitral regurgitation. He has undergone cardiac catheterization which confirmed the presence of normal coronary artery anatomy and no significant coronary artery disease.  Because of her worsening symptoms, she was seen in March 2012 in consultation by Dr. Cornelius Moras for consideration of mitral valve repair or replacement.  She had previously been seen at [facility] for consideration of balloon valvuloplasty but was felt to not be an appropriate candidate.  After review of her  films, Dr. Cornelius Moras agreed with the need for mitral valve repair or replacement at this time.  Based on the functional anatomy of her mitral valve, he felt that repair might not be feasible, and even if it was feasible there would be questions about its long-term durability.  For these reasons, he recommended elective mitral valve replacement at this time and discussed the need for long-term anticoagulation with mechanical valve.  All risks, benefits, and alternatives of surgery were explained to the patient and she did agree to proceed.  She did undergo preoperative CT angiogram of the chest, abdomen, and pelvis and this showed no significant aortoiliac occlusive disease.  HOSPITAL COURSE:  Ms. [name] was admitted to [facility] on July 16, 2010, and was taken to the operating room where she underwent mitral valve replacement as described above.  Please see previously dictated operative report for complete details of surgery.  She tolerated the procedure well and was transferred to the SICU in stable condition.  She was extubated shortly after surgery.  She was hemodynamically stable and doing well on postop day 1.  She was able to be transferred to the step- down unit late in the day on postop day 1.  Her chest tubes remained in place and output steadily decreased over the next 48 hours.  Her chest tubes were ultimately able to be discontinued on postop day 4.  She did have several intermittent episodes of  atrial fibrillation with heart rates in the 130s-150s on postop day 4.  She was treated with one dose of IV Lopressor which converted her to normal sinus rhythm; however, she did have a recurrence later in the day which was again treated with IV Lopressor and converted to sinus rhythm.  Her p.o. dose of Lopressor was increased and she has had no further arrhythmias.  Also, she was noted to have a leukocytosis with white blood cell count up to 23,000 with no fevers.  She had no  evidence on physical exam of infection.  An urinalysis was obtained which was positive for bacteria and a few Trichomonas.  She was started empirically on p.o. antibiotics as well as one dose of Flagyl.  A urine culture ultimately revealed no significant growth.  She has been continued on antibiotics and presently has completed almost 4 days worth of Ceftin.  Her white blood cell count is trending downward and she has continued to remain afebrile.  Also, she has been started on Coumadin for her mechanical valve and her anticoagulation has been very slowly increasing.  Presently, her INR is 1.5 with a PT of 18.6.  Other labs on postop day 5 show a hemoglobin of 8.5 for which she has been started on iron supplementation.  Also, hematocrit 25.3, platelets 276, white count 13.3, sodium 134, potassium 4.3, BUN 9, creatinine 0.84.  Chest x-ray is stable with no pneumothorax following chest tube removal.  She continues to progress well.  We will repeat a CBC and a BMET as well as a PT and INR on the morning of July 22, 2010.  She will also be observed for the next 24 hours for any further arrhythmias.  It was felt that if she continues to remain stable over the next 24 hours, she will hopefully be ready for discharge home.  DISCHARGE MEDICATIONS: 1. Coumadin 5 mg daily or as directed by the [facility]. 2. Amiodarone 200 mg b.i.d. 3. Lasix 40 mg daily x7 days. 4. Nu-Iron 150 mg daily. 5. Metoprolol 50 mg b.i.d. 6. Oxycodone IR 5-10 mg q.3-4 h. p.r.n. for pain. 7. Potassium 20 mEq daily x 7 days. 8. Enteric-coated aspirin 81 mg daily. 9. Flonase 1 spray in each nostril daily as needed. 10.Prilosec 120 mg 1 capsule daily. 11.Provera 2 puffs daily p.r.n. for shortness of breath. 12.Prozac 40 mg daily. 13.Zyrtec 10 mg daily.  DISCHARGE INSTRUCTIONS:  She is asked to refrain from driving, heavy lifting, or strenuous activity.  She may continue ambulating daily and using her  incentive spirometer.  She may shower daily and clean her incisions with soap and water.  She will continue her same preoperative diet.  DISCHARGE FOLLOWUP:  She will need to make an appointment to see Dr. Jens Som in 2 weeks.  She will also need to have a PT and INR drawn at the [facility] within 48 hours of discharge for management of her anticoagulation in hopes to keep her INR between 2.5 and 3.5 for her mechanical valve.  Also, she will see Dr. Cornelius Moras back in the office in 1 week with a chest x-ray from [facility].  If she experiences any problems or has any questions following discharge, she is asked to contact our office.     [name], P.A.   ______________________________ Salvatore Decent. Cornelius Moras, M.D.    GC/MEDQ  D:  07/21/2010  T:  07/21/2010  Job:  [id number]  cc:   Sandford Craze, NP [facility] Office.  Electronically Signed by [name] P.A. on 08/01/2010 02:21:04 PM Electronically Signed by Tressie Stalker M.D. on 08/04/2010 08:39:13 AM

## 2010-08-04 NOTE — Discharge Summary (Signed)
  NAME:  Julia Garza, FELS NO.:  [id number]  MEDICAL RECORD NO.:  [id number]           PATIENT TYPE:  I  LOCATION:  2037                         FACILITY:  [facility]  PHYSICIAN:  Salvatore Decent. Cornelius Moras, M.D. DATE OF BIRTH:  1960-08-01  DATE OF ADMISSION:  07/16/2010 DATE OF DISCHARGE:  07/23/2010                              DISCHARGE SUMMARY   ADDENDUM  This is an addendum to previously dictated discharge summary.  Ms. [name] was originally scheduled for discharge home on July 22, 2010; however, she had developed low-grade fever on the evening prior to discharge and just did not feel as well on exam the following morning.  Her white blood cell count had continued to trend downward, and she was treated with continued aggressive pulmonary toilet measures.  She did complete a 5-day course of antibiotics for urinary tract infection, and those have been discontinued at this time.  For the past 24 hours, she has remained afebrile and other vital signs have been stable.  She continues to maintain sinus rhythm on amiodarone and an increased dose of Lopressor. She has overall continued to progress well and has been seen and evaluated on July 23, 2010, and deemed ready for discharge home.  On physical exam, her incisions are all healing well.  She has diuresed back down to within 1 kg of her preoperative weight.  She is tolerating a regular diet and ambulating in the halls without difficulty.  DISCHARGE MEDICATIONS: 1. Coumadin 5 mg daily or as directed. 2. Metoprolol 75 mg b.i.d. 3. Amiodarone 200 mg b.i.d. 4. Nu-Iron 150 mg daily. 5. Oxycodone IR 5-10 mg q.3-4 h. p.r.n. for pain. 6. Potassium 20 mEq daily x7 days. 7. Enteric-coated aspirin 81 mg daily. 8. Flonase 1 spray daily as needed. 9. Lasix 40 mg daily x7 days. 10.Prilosec 20 mg daily. 11.ProAir 2 puffs daily p.r.n. 12.Prozac 40 mg daily. 13.Zyrtec 10 mg daily.  DISCHARGE INSTRUCTIONS:  Unchanged from the  previously dictated discharge summary.  She will follow up with the [facility] on Friday, July 25, 2010, for management of her anticoagulation.  At the time of discharge, her PT is 23.7 with an INR of 2.1.  She does have a mechanical mitral valve.  She will follow up as directed with Dr. Jens Som and is scheduled with Dr. Cornelius Moras.  She will call our office if she experiences any problems or has questions in the interim.       [name], P.A.   ______________________________ Salvatore Decent. Cornelius Moras, M.D.    GC/MEDQ  D:  07/23/2010  T:  07/23/2010  Job:  [id number]  cc:   Madolyn Frieze. Jens Som, MD, [facility] Sandford Craze, NP [facility] Office  Electronically Signed by Weldon Inches. on 08/01/2010 02:21:39 PM Electronically Signed by Tressie Stalker M.D. on 08/04/2010 08:39:15 AM

## 2010-08-05 ENCOUNTER — Inpatient Hospital Stay (HOSPITAL_COMMUNITY): Payer: Managed Care, Other (non HMO)

## 2010-08-05 DIAGNOSIS — I319 Disease of pericardium, unspecified: Secondary | ICD-10-CM

## 2010-08-05 LAB — SEDIMENTATION RATE: Sed Rate: 130 mm/hr — ABNORMAL HIGH (ref 0–22)

## 2010-08-05 LAB — URINE CULTURE
Colony Count: NO GROWTH
Culture  Setup Time: 201205071632
Culture: NO GROWTH

## 2010-08-05 LAB — PROTIME-INR: INR: 2.41 — ABNORMAL HIGH (ref 0.00–1.49)

## 2010-08-05 LAB — DIFFERENTIAL
Basophils Absolute: 0.1 10*3/uL (ref 0.0–0.1)
Lymphocytes Relative: 17 % (ref 12–46)
Lymphs Abs: 1.8 10*3/uL (ref 0.7–4.0)
Monocytes Absolute: 1.8 10*3/uL — ABNORMAL HIGH (ref 0.1–1.0)
Neutro Abs: 6.8 10*3/uL (ref 1.7–7.7)

## 2010-08-05 LAB — CBC
HCT: 23.6 % — ABNORMAL LOW (ref 36.0–46.0)
Hemoglobin: 7.9 g/dL — ABNORMAL LOW (ref 12.0–15.0)
MCHC: 33.5 g/dL (ref 30.0–36.0)
MCV: 93.7 fL (ref 78.0–100.0)

## 2010-08-05 NOTE — Assessment & Plan Note (Signed)
OFFICE VISIT  Julia Garza, Julia Garza DOB:  May 14, 1960                                        Aug 04, 2010 CHART #:  [id number]  The patient returns for the further followup of her febrile illness. She was just seen in the office 3 days ago.  Blood work sent at that time was notable for elevated white blood count of 18,300.  All the rest of her blood work was normal and blood cultures remained no growth to date.  Followup chest x-ray performed today looks good.  The patient feels worse.  She has now developed diarrhea as well as generalized malaise.  She has a low-grade fever here in the office today.  We will go ahead and admit her to the hospital for further evaluation and therapy.  Salvatore Decent. Cornelius Moras, M.D. Electronically Signed  CHO/MEDQ  D:  08/04/2010  T:  08/05/2010  Job:  [id number]

## 2010-08-06 DIAGNOSIS — R509 Fever, unspecified: Secondary | ICD-10-CM

## 2010-08-06 LAB — CMV ANTIBODY, IGG (EIA): CMV Ab - IgG: 4.98 — ABNORMAL HIGH (ref <0.90)

## 2010-08-06 LAB — EPSTEIN-BARR VIRUS VCA ANTIBODY PANEL: EBV VCA IgM: 0.41 {ISR}

## 2010-08-06 LAB — CMV IGM: CMV IgM: 1.84 — ABNORMAL HIGH (ref <0.90)

## 2010-08-06 NOTE — H&P (Signed)
NAME:  Julia Garza, Julia Garza NO.:  [id number]  MEDICAL RECORD NO.:  [id number]           PATIENT TYPE:  I  LOCATION:  2030                         FACILITY:  [facility]  PHYSICIAN:  Salvatore Decent. Cornelius Moras, M.D. DATE OF BIRTH:  [birth year]  DATE OF ADMISSION:  08/04/2010 DATE OF DISCHARGE:                             HISTORY & PHYSICAL   CHIEF COMPLAINT:  Fever.  HISTORY OF PRESENT ILLNESS:  Ms. Mccallum is a 50 year old female who is status post a right minithoracotomy for mitral valve replacement with a 29-mm [employer] mechanical prosthetic valve by Dr. Cornelius Moras on July 16, 2010.  Her postoperative course was notable for leukocytosis with white count up to 23,000.  Her only positive study was urinalysis which showed bacteria and a few Trichomonas.  A urine culture was ultimately noted to be negative but the patient was treated empirically with Flagyl as well as Ceftin with improvement of her white blood cell count.  At the time of her discharge home on July 23, 2010, her white count was back to 11,000 and she had completed a full course of p.o. antibiotics.  She was otherwise doing well, was afebrile.  Since her discharge from the hospital, she has been seen in our office on two other occasions. Originally, she was seen on July 28, 2010, and was doing well except for a low-grade fever and a productive cough.  She had also had some sweats and chills.  Chest x-ray showed slight haziness of the right lung base which actually was improved compared to her last hospital x-ray. Otherwise, on physical exam, she was doing well and showed no signs or symptoms of infection.  Because of her recent hospitalization and prosthetic valve, Dr. Cornelius Moras elected to treat her with a 10-day course of Avelox for presumed tracheobronchitis.  She was asked to followup in 3 weeks with a repeat chest x-ray.  However, she continued to spike fevers and returned to the office on Aug 01, 2010, for a recheck.  At  that time, she was seen by Dr. Cornelius Moras and felt that although her cough was somewhat improved, she otherwise had no changes in her symptoms.  She continued to run fevers, particularly at night, associated with some diaphoresis and weakness.  Her exam was completely normal and at that time Dr. Cornelius Moras discussed the possibility of admitting the patient for further evaluation versus continued outpatient monitoring.  He also felt that there was a possibility that this could be a drug related reaction to amiodarone and stopped her amiodarone.  Also, he stopped her Avelox and asked her to return to the office today for recheck.  Today, she returns to the office with persistent fevers which have spiked as high as 101 to 103, at night, particularly.  She has had continued productive cough with mostly white sputum production.  She has also had worsening exertional dyspnea and generalized malaise.  She continues to have sweats, particularly at night and now has developed some diarrhea over the past 24 hours.  Dr. Cornelius Moras evaluated the patient and felt that at this time she should be admitted for workup for her  fevers of unknown etiology.  PAST MEDICAL HISTORY: 1. History of mitral stenosis and mitral regurgitation, status post     mitral valve replacement as described above. 2. History of acute-on-chronic systolic and diastolic CHF. 3. Postoperative atrial fibrillation, now in sinus rhythm. 4. Hypertension. 5. Gastroesophageal reflux disease. 6. Asthma. 7. Allergic rhinitis. 8. Remote history of tobacco abuse.  PAST SURGICAL HISTORY: 1. Right minithoracotomy for mitral valve replacement as described     above. 2. ORIF of right femur fracture at age 26. 3. Cesarean section with bilateral tubal ligations. 4. Partial hysterectomy for uterine fibroids.  FAMILY HISTORY:  Noncontributory to the present illness.  SOCIAL HISTORY:  The patient is single and resides in [place].  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 She has three grown children.  She has a remote history of tobacco abuse and quit smoking entirely in 2000.  She denies excessive alcohol consumption.  CURRENT MEDICATIONS: 1. Prozac 40 mg daily. 2. Flonase 1 spray in each nostril b.i.d. 3. Metoprolol 75 mg b.i.d. 4. Provera inhaler p.r.n. 5. Prilosec 20 mg daily. 6. Loratadine 10 mg daily. 7. Coumadin 5 mg daily or as directed. 8. Iron 150 mg daily. 9. Oxycodone IR 5 mg q. 3-4 hours p.r.n. for pain.  ALLERGIES:  No known drug allergies.  REVIEW OF SYSTEMS:  See history of present illness for pertinent positives and negatives.  PHYSICAL EXAMINATION:  VITAL SIGNS:  Blood pressure is 108/71, pulse is 88, respirations 20, O2 sat 98% on room air, temperature 100.1. Physical exam performed by Dr. Cornelius Moras revealed HEENT:  Within normal limits. All surgical incision sites are clean, dry and intact without erythema or drainage. HEART:  Regular rhythm without murmurs, rubs or gallops. LUNGS:  Clear to auscultation. ABDOMEN:  Soft, nontender, nondistended with active bowel sounds. LOWER EXTREMITIES:  Without significant edema.  LABORATORY DATA:  Drawn on her office visit on Aug 01, 2010, showed a white blood cell count 18.3, hemoglobin 9.0, hematocrit 28.1, platelets 830.  Sodium 135, potassium 5.1, BUN 16, creatinine 1.10, sed rate 14. Blood cultures x3 were negative.  Chest x-ray on Aug 04, 2010, in [facility] revealed improvement in linear atelectasis bilaterally with no effusions and stable cardiomegaly.  ASSESSMENT/PLAN:  Ms. Marker continues to have fevers of unknown origin status post a right minithoracotomy for mitral valve replacement.  She will be admitted to [facility] for further workup.     [name], P.A.   ______________________________ Salvatore Decent. Cornelius Moras, M.D.    GC/MEDQ  D:  08/04/2010  T:  08/05/2010  Job:  [id number]  cc:   Madolyn Frieze. Jens Som,  MD, [facility] Sandford Craze, NP  Electronically Signed by [name] P.A. on 08/05/2010 01:10:48 PM Electronically Signed by Tressie Stalker M.D. on 08/06/2010 10:12:35 AM

## 2010-08-07 LAB — CBC
HCT: 27.7 % — ABNORMAL LOW (ref 36.0–46.0)
Hemoglobin: 8.8 g/dL — ABNORMAL LOW (ref 12.0–15.0)
MCH: 29.8 pg (ref 26.0–34.0)
MCHC: 31.8 g/dL (ref 30.0–36.0)
MCV: 93.9 fL (ref 78.0–100.0)
RBC: 2.95 MIL/uL — ABNORMAL LOW (ref 3.87–5.11)

## 2010-08-08 ENCOUNTER — Encounter: Payer: Managed Care, Other (non HMO) | Admitting: *Deleted

## 2010-08-08 LAB — CULTURE, ROUTINE-ABSCESS: Culture: NO GROWTH

## 2010-08-10 LAB — CULTURE, BLOOD (ROUTINE X 2)
Culture  Setup Time: 201205072358
Culture: NO GROWTH

## 2010-08-11 ENCOUNTER — Ambulatory Visit (INDEPENDENT_AMBULATORY_CARE_PROVIDER_SITE_OTHER): Payer: Managed Care, Other (non HMO) | Admitting: *Deleted

## 2010-08-11 DIAGNOSIS — Z954 Presence of other heart-valve replacement: Secondary | ICD-10-CM

## 2010-08-11 DIAGNOSIS — Z7901 Long term (current) use of anticoagulants: Secondary | ICD-10-CM

## 2010-08-11 DIAGNOSIS — I052 Rheumatic mitral stenosis with insufficiency: Secondary | ICD-10-CM

## 2010-08-11 LAB — POCT INR: INR: 2.8

## 2010-08-12 NOTE — Discharge Summary (Signed)
NAME:  Julia Garza, Julia Garza NO.:  [id number]  MEDICAL RECORD NO.:  [id number]           PATIENT TYPE:  LOCATION:                                 FACILITY:  PHYSICIAN:  Salvatore Decent. Cornelius Moras, M.D. 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  SECONDARY DIAGNOSES: 1. History of mitral stenosis and mitral regurgitation status post     mitral valve replacement done by Dr. Cornelius Moras on July 16, 2010, using     a 29-mm Carbo[employer] mechanical prosthetic valve. 2. 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 3. Postoperative atrial fibrillation with conversion back to normal     sinus rhythm. 4. Hypertension. 5. Gastroesophageal reflux disease. 6. Asthma. 7. Allergic rhinitis. 8. Remote history of tobacco abuse. 9. Status post open reduction and internal fixation of right femur     fracture, age 2. 10.Status post cesarean section with bilateral tubal ligation. 11.Status post partial hysterectomy for uterine fibroids.  IN-HOSPITAL OPERATIONS AND PROCEDURES:  CT-guided biopsy of anterior mediastinal fluid aspiration.  HISTORY AND PHYSICAL AND HOSPITAL COURSE:  Ms. Cermak is a 50 year old female who is status post right mini thoracotomy for mitral valve replacement with a 29-mm Carbo[employer] mechanical prosthetic valve done by Dr. Cornelius Moras on July 16, 2010.  The patient's postoperative course was notable for leukocytosis with white count of as high as 23.2.  Her only positive study was urinalysis which showed bacteria and a few Trichomonas.  Urine culture was ultimately noted to be negative.  The patient was treated empirically with Flagyl as well as Ceftin with improvement of her white blood cell count.  At the time of discharge to home on July 23, 2010, her white blood cell count was back to  11,000 and she completed a course of p.o. antibiotics.  She was, otherwise, doing well and afebrile.  Since her discharge from the hospital, she has been seen in the office by Dr. Cornelius Moras on two separate occasions.  On July 28, 2010, the patient is doing well except low-grade fever and productive cough.  She also had some sweats and chills.  Chest x-ray showed slight haziness in the right lung base which actually was improved compared to her last hospital x-ray.  The patient was started on 10-day course of Avelox at that time.  The patient was scheduled for followup appointment with Dr. Cornelius Moras after 3 weeks with repeat chest x- ray.  However, she continued to spike fevers and returned to the office on Aug 01, 2010.  Due to the patient's persistent fevers of unknown origin and felt that she should be readmitted to [facility] for further workup.  Dr. Cornelius Moras discussed this with the patient.  The patient acknowledged understanding and agreed to be admitted.  For further details of the patient's past medical history and physical exam, please see dictated H and P.  The patient was admitted to [facility] on Aug 04, 2010, with fever of unknown origin and  following right miniature thoracotomy for mitral valve replacement.  On admission, blood work was obtained which showed white blood cell count of 12.0, hemoglobin of 8.3, hematocrit 24.6, platelet count 781.  INR was 2.55.  Urinalysis was negative. Blood cultures obtained were negative to date.  Urine culture was negative.  A 2-D echocardiogram was ordered and done showing ejection fraction of 55-60%.  The mitral valve had mechanical prosthetic noted to be present and mobility was not restricted.  Small pericardial effusion was identified.  CT scan of the chest, abdomen, and pelvis was obtained showing a fluid collection in the right pericardiophrenic angle and inferior to the heart.  Abscess cannot be excluded.  There is no evidence  of abscess in the abdomen or pelvis.  Infectious Disease was consulted and evaluated by Dr. Lina Sayre.  He felt that this is probably viral like illness.  At this time, they plan to follow until CT was read.  After finding out that CT revealed small loculated fluid collection between the right middle lobe and right atrium, it was discussed to consult Interventional Radiology to do a CT- guided biopsy.  This was done on Aug 05, 2010.  The patient underwent anterior mediastinal fluid collection aspiration.  She tolerated this well.  Results from this currently are negative to date.  White blood cell count was monitored and most recent level is 10.7.  At this time, the patient is currently afebrile.  Plan will be to monitor the patient for another 24 hours and if remains stable, we will discharge to home. During this time, the patient has been up, ambulating with assistance. She is tolerating diet well.  All incisions remained clean, dry, and intact and healing.  Prior to hospital discharge, CMV titers returned highly positive for both IgM and IgG antibodies, consistent with acute viral infection.  All other cultures remained negative.  FOLLOWUP APPOINTMENTS:  A followup appointment has been arranged with Dr. Cornelius Moras for Aug 18, 2010, at 1:15 p.m.  The patient will need to obtain PA and lateral chest x-ray 45 minutes prior to this appointment.  The patient will need to follow up with Dr. Jens Som as instructed.  ACTIVITY:  The patient is instructed no driving until released to do so, no lifting over 10 pounds.  She is told to ambulate three to four times per day, progress as tolerated, and continue her breathing exercises.  INCISIONAL CARE:  The patient is told shower washing her incisions using soap and water.  She is to contact the office if she develops any drainage or opening from any of her incision sites.  DIET:  The patient is educated on diet to be low fat, low salt.  DISCHARGE  MEDICATIONS: 1. Ibuprofen 800 mg b.i.d. 2. Enteric-coated aspirin 81 mg daily. 3. Flonase nasal spray 1 spray daily p.r.n. 4. Iron complex, Nu-Iron, 150 mg daily. 5. Metoprolol 75 mg b.i.d. 6. Prilosec 20 mg daily. 7. ProAir inhaler 2 puffs daily p.r.n. 8. Prozac 40 mg daily. 9. Oxycodone 5 mg one to two tabs q.3 h. p.r.n. pain. 10.Coumadin 5 mg daily.     Sol Blazing, PA   ______________________________ Salvatore Decent. Cornelius Moras, M.D.    KMD/MEDQ  D:  08/06/2010  T:  08/06/2010  Job:  [id number]  cc:   Madolyn Frieze. Jens Som, MD, [facility] Sandford Craze, NP  Electronically Signed by Cameron Proud PA on 08/12/2010 02:11:37 PM Electronically Signed by Tressie Stalker M.D. on 08/12/2010 04:25:40 PM

## 2010-08-15 ENCOUNTER — Other Ambulatory Visit: Payer: Self-pay | Admitting: Thoracic Surgery (Cardiothoracic Vascular Surgery)

## 2010-08-15 DIAGNOSIS — I059 Rheumatic mitral valve disease, unspecified: Secondary | ICD-10-CM

## 2010-08-18 ENCOUNTER — Encounter (INDEPENDENT_AMBULATORY_CARE_PROVIDER_SITE_OTHER): Payer: Self-pay | Admitting: Thoracic Surgery (Cardiothoracic Vascular Surgery)

## 2010-08-18 ENCOUNTER — Ambulatory Visit
Admission: RE | Admit: 2010-08-18 | Discharge: 2010-08-18 | Disposition: A | Discharge status: Home / Self Care | Payer: Managed Care, Other (non HMO) | Source: Ambulatory Visit | Attending: Thoracic Surgery (Cardiothoracic Vascular Surgery) | Admitting: Thoracic Surgery (Cardiothoracic Vascular Surgery)

## 2010-08-18 DIAGNOSIS — I059 Rheumatic mitral valve disease, unspecified: Secondary | ICD-10-CM

## 2010-08-18 DIAGNOSIS — I050 Rheumatic mitral stenosis: Secondary | ICD-10-CM

## 2010-08-18 NOTE — Assessment & Plan Note (Signed)
OFFICE VISIT  Julia Garza, Julia Garza DOB:  1961-03-18                                        Aug 18, 2010 CHART #:  [id number]  HISTORY OF PRESENT ILLNESS:  The patient returns to the office today for further followup after right minithoracotomy for mitral valve replacement on July 16, 2010.  We admitted her to the hospital on Aug 04, 2010, because of fevers.  She underwent a fairly exhaustive workup as well as consultation from the Infectious Disease Service.  As it turns out, all of her cultures remained negative and there was no clear source of infection other than the fact that serology from CMV titers came back consistent with acute reactivation of CMV, with fairly high levels of both IgG and IgM antibodies.  All antibiotics were discontinued prior to that admission and all other cultures remained negative.  She then underwent needle aspiration of small pocket of fluid located in the anterior chest that turned out to be serous fluid with culture negative results.  She was discharged from the hospital on continuous running ibuprofen and her fevers and symptoms all resolved.  She returns to our office today for further followup.  She reports that she is doing well. She is no longer having fevers and night sweats.  She is not having any sort of cough.  She still has soreness in her chest.  She states that her stomach starting to become more sensitive to the ibuprofen.  She is hoping to stop ibuprofen therapy.  Last week, she called and was noted to have increased swelling in her lower extremities and her chest, and her weight was up 10 pounds.  We restarted her on Lasix at that time and her weight has gone back down to her baseline.  She has not had any shortness of breath.  The remainder of her review of systems is unremarkable.  PHYSICAL EXAMINATION:  A well-appearing female with blood pressure 156/92, pulse 88, oxygen saturation 98% on room air.  Temperature  99.4 degrees Fahrenheit.  Examination of the chest reveals minithoracotomy scar that is healing nicely.  Auscultation reveals clear breath sounds that are symmetrical bilaterally.  No wheezes, rales, or rhonchi are noted.  Cardiovascular exam demonstrates regular rate and rhythm.  No murmurs, rubs, or gallops are appreciated.  The abdomen is soft, nontender.  The extremities are warm and well perfused.  There is no lower extremity edema.  IMPRESSION:  The patient seems to be doing well at this time.  Her blood pressure is up a little bit today.  Her weight is back down to baseline on stable running once a day Lasix.  Her Coumadin management has gone without complication and her febrile illness seems to have resolved.  PLAN:  I have instructed the patient to go ahead and stop taking ibuprofen.  I have given her prescription for Vicodin tablets to take as needed for pain that is unrelieved by over-the-counter Tylenol.  I have encouraged her to continue to gradually increase her physical activity. All of her questions have been addressed.  She plans to see Dr. Jens Som, for followup later this week.  We will defer any subsequent adjustment in her antihypertensive medications to Dr. Ludwig Clarks judgment.  We will plan to see her back in 6 weeks with a followup chest x-ray.  Salvatore Decent. Cornelius Moras, M.D. Electronically Signed  CHO/MEDQ  D:  08/18/2010  T:  08/18/2010  Job:  [id number]  cc:   Madolyn Frieze. Jens Som, MD, [facility] Sandford Craze, NP

## 2010-08-18 NOTE — Assessment & Plan Note (Signed)
OFFICE VISIT  COURTNEI, RUDDELL DOB:  December 19, 1960                                        Aug 18, 2010 CHART #:  [id number]  ADDENDUM  Chest performed today at the [facility] is reviewed. This demonstrates stable radiographic appearance of the chest.  There is stable cardiomegaly.  There is mild opacity in the right midlung zone which is unchanged.  There are no pleural effusions.  There is no pulmonary edema.  No other abnormalities are noted.  Salvatore Decent. Cornelius Moras, M.D. Electronically Signed  CHO/MEDQ  D:  08/18/2010  T:  08/18/2010  Job:  [id number]

## 2010-08-19 ENCOUNTER — Encounter: Payer: Self-pay | Admitting: Cardiology

## 2010-08-20 ENCOUNTER — Encounter: Payer: Self-pay | Admitting: Cardiology

## 2010-08-20 ENCOUNTER — Ambulatory Visit (INDEPENDENT_AMBULATORY_CARE_PROVIDER_SITE_OTHER): Payer: Managed Care, Other (non HMO) | Admitting: Cardiology

## 2010-08-20 DIAGNOSIS — I509 Heart failure, unspecified: Secondary | ICD-10-CM

## 2010-08-20 DIAGNOSIS — I10 Essential (primary) hypertension: Secondary | ICD-10-CM

## 2010-08-20 DIAGNOSIS — E785 Hyperlipidemia, unspecified: Secondary | ICD-10-CM

## 2010-08-20 DIAGNOSIS — Z954 Presence of other heart-valve replacement: Secondary | ICD-10-CM

## 2010-08-20 DIAGNOSIS — Z952 Presence of prosthetic heart valve: Secondary | ICD-10-CM

## 2010-08-20 DIAGNOSIS — R509 Fever, unspecified: Secondary | ICD-10-CM | POA: Insufficient documentation

## 2010-08-20 DIAGNOSIS — I059 Rheumatic mitral valve disease, unspecified: Secondary | ICD-10-CM

## 2010-08-20 NOTE — Assessment & Plan Note (Signed)
Management per primary care. 

## 2010-08-20 NOTE — Assessment & Plan Note (Addendum)
Blood pressure controlled. 

## 2010-08-20 NOTE — Progress Notes (Signed)
HPI:50 year old female for f/u of MS/MR. On July 17, 2010 the patient had right miniature thoracotomy for mitral valve replacement (29-mm [employer] mechanical prosthesis). Note preoperative cardiac catheterization showed no coronary disease. Patient readmitted May 2012 with fever. Blood cultures were negative. There was a small fluid collection in the anterior chest that was aspirated which was negative. Echocardiogram in May of 2012 showed normal LV function, biatrial enlargement, prosthetic mitral valve, moderate tricuspid regurgitation and mild aortic insufficiency. 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 Since then, the patient did have some increased dyspnea and pedal edema following discharge from her initial mitral valve replacement. Lasix was resumed and this resolved. She now denies dyspnea, chest pain, palpitations or syncope. She did have a fever 5 days ago. She was seen by Dr. Cornelius Moras by her report on May 21. He discontinued her nonsteroidal at that time. She had fever and chills yesterday with a temperature of 102. She denies congestion, cough, nausea, diarrhea, dysuria, rash, arthralgias. She has no localizing symptoms of infection.  Current Outpatient Prescriptions  Medication Sig Dispense Refill  . acetaminophen (TYLENOL) 500 MG tablet Take 500 mg by mouth every 6 (six) hours as needed.        Marland Kitchen albuterol (PROAIR HFA) 108 (90 BASE) MCG/ACT inhaler Inhale 2 puffs into the lungs as needed.        Marland Kitchen aspirin 81 MG EC tablet Take 81 mg by mouth daily.        . diphenhydrAMINE (BENADRYL) 25 MG tablet Take 25 mg by mouth at bedtime as needed.        Marland Kitchen FLUoxetine (PROZAC) 40 MG capsule Take 40 mg by mouth daily.        . fluticasone (FLONASE) 50 MCG/ACT nasal spray 2 sprays by Nasal route daily.  16 g  0  . furosemide (LASIX) 40 MG tablet Take 1 1/2 tablet by mouth daily.       Marland Kitchen HYDROcodone-acetaminophen (VICODIN) 5-500 MG per tablet Take 1 tablet by mouth every 6 (six) hours as needed.        . iron  polysaccharides (NIFEREX) 150 MG capsule Take 150 mg by mouth 1 dose over 46 hours.        Marland Kitchen loratadine (CLARITIN) 10 MG tablet Take 10 mg by mouth daily.        . metoprolol tartrate (LOPRESSOR) 25 MG tablet Take by mouth. Take 3 tablets in the morning and 3 tablets in the evening      . omeprazole (PRILOSEC OTC) 20 MG tablet Take 20 mg by mouth daily.        Marland Kitchen warfarin (COUMADIN) 5 MG tablet Take 5 mg by mouth as directed.        Marland Kitchen DISCONTD: oxyCODONE (OXY IR/ROXICODONE) 5 MG immediate release tablet Take 1-2 tablets by mouth 4 times daily as needed.      Marland Kitchen KLOR-CON M20 20 MEQ tablet Take 20 mEq by mouth daily.       Marland Kitchen DISCONTD: amiodarone (PACERONE) 200 MG tablet Take 1 tablet by mouth BID times 48H.         Past Medical History  Diagnosis Date  . Hypertension   . Hyperlipidemia   . GERD (gastroesophageal reflux disease)   . Depression   . Asthma   . Eczema   . History of rheumatic fever   . Right shoulder injury     history of  . Mitral stenosis with regurgitation     Past Surgical History  Procedure Date  .  Cesarean section 1991  . Abdominal hysterectomy 01/2004    partial  . Breast surgery 05/2007    left breast biposy  . Femur fracture surgery 1983    right  . 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 Tressie Stalker    History   Social History  . Marital Status: Single    Spouse Name: N/A    Number of Children: 3  . Years of Education: N/A   Occupational History  . SALES    Social History Main Topics  . Smoking status: Former Games developer  . Smokeless tobacco: Not on file   Comment: briefly as teenager  . Alcohol Use: No  . Drug Use: No  . Sexually Active: Not on file   Other Topics Concern  . Not on file   Social History Narrative   Regular exercise: yes    ROS: no fevers or chills, productive cough, hemoptysis, dysphasia, odynophagia, melena, hematochezia, dysuria, hematuria, rash, seizure activity, orthopnea, PND,  pedal edema, claudication. Remaining systems are negative.  Physical Exam: Well-developed well-nourished in no acute distress.  Skin is warm and dry. No rash. No peripheral stigmata of SBE. HEENT is normal.  Neck is supple. No thyromegaly.  Chest is clear to auscultation with normal expansion. Cardiovascular exam is regular rate and rhythm. Crisp mechanical valve sound with no systolic murmur. Abdominal exam nontender or distended. No masses palpated. Extremities show no edema. neuro grossly intact

## 2010-08-20 NOTE — Patient Instructions (Signed)
Your physician recommends that you return for lab work tomorrow: blood cultures x 2/ bmp/ cbc/ sed rate (424.0;780.60)  Your physician recommends that you schedule a follow-up appointment in: 1 week.

## 2010-08-20 NOTE — Assessment & Plan Note (Signed)
Patient is having fever following mitral valve replacement of uncertain etiology. She was admitted by Dr. Cornelius Moras and blood cultures were negative. There was a fluid collection in the anterior chest which was aspirated and was negative. She was seen by infectious disease and this was felt most likely secondary to CMV. She is continuing to have fevers. Her sedimentation rate has also been elevated. I find no peripheral stigmata of SBE on examination. She has no symptoms of post pericardotomy syndrome. I did discuss the patient with Dr. Cornelius Moras today. I will plan blood cultures x2, CBC and sedimentation rate. We are not convinced that she has endocarditis. I will contact infectious disease- Dr. Maurice March- when he is available and request followup with him. 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

## 2010-08-20 NOTE — Assessment & Plan Note (Signed)
Continued SBE prophylaxis. 

## 2010-08-20 NOTE — Assessment & Plan Note (Signed)
Patient with volume excess following surgery now improved with diuretics. Will continue. Check potassium and renal function.

## 2010-08-21 ENCOUNTER — Other Ambulatory Visit (INDEPENDENT_AMBULATORY_CARE_PROVIDER_SITE_OTHER): Payer: Managed Care, Other (non HMO) | Admitting: *Deleted

## 2010-08-21 DIAGNOSIS — R0989 Other specified symptoms and signs involving the circulatory and respiratory systems: Secondary | ICD-10-CM

## 2010-08-22 ENCOUNTER — Telehealth: Payer: Self-pay | Admitting: Cardiology

## 2010-08-22 NOTE — Telephone Encounter (Signed)
I am not sure who called this patient. Her lab results are not back. We will notify her once they are.

## 2010-08-22 NOTE — Telephone Encounter (Signed)
Returning called back  

## 2010-08-26 ENCOUNTER — Other Ambulatory Visit: Payer: Self-pay | Admitting: Cardiology

## 2010-08-26 ENCOUNTER — Ambulatory Visit (INDEPENDENT_AMBULATORY_CARE_PROVIDER_SITE_OTHER): Payer: Managed Care, Other (non HMO) | Admitting: Cardiology

## 2010-08-26 ENCOUNTER — Encounter: Payer: Self-pay | Admitting: Cardiology

## 2010-08-26 VITALS — BP 164/98 | HR 88 | Resp 18 | Ht 61.0 in | Wt 187.8 lb

## 2010-08-26 DIAGNOSIS — G8912 Acute post-thoracotomy pain: Secondary | ICD-10-CM

## 2010-08-26 DIAGNOSIS — I509 Heart failure, unspecified: Secondary | ICD-10-CM

## 2010-08-26 DIAGNOSIS — E876 Hypokalemia: Secondary | ICD-10-CM

## 2010-08-26 DIAGNOSIS — I059 Rheumatic mitral valve disease, unspecified: Secondary | ICD-10-CM

## 2010-08-26 DIAGNOSIS — E785 Hyperlipidemia, unspecified: Secondary | ICD-10-CM

## 2010-08-26 DIAGNOSIS — I10 Essential (primary) hypertension: Secondary | ICD-10-CM

## 2010-08-26 MED ORDER — METOPROLOL TARTRATE 100 MG PO TABS: 100.0000 mg | ORAL_TABLET | Freq: Two times a day (BID) | ORAL | Status: DC

## 2010-08-26 MED ORDER — POTASSIUM CHLORIDE CRYS ER 20 MEQ PO TBCR: EXTENDED_RELEASE_TABLET | ORAL | Status: DC

## 2010-08-26 NOTE — Patient Instructions (Signed)
Your physician recommends that you schedule a follow-up appointment in: 4 weeks with Dr. Doreatha Lew. Your MD recommend for you to have a CT scan of the chest. Your physician has recommended you make the following change in your medication: increase Lopressor to 100 mg twice a day. Increase Potassium 20 MEq twice a day. Your physician recommends that you return for lab work in: BMET in one week.

## 2010-08-26 NOTE — Progress Notes (Signed)
HPI:50 year old female for f/u of MS/MR. On July 17, 2010 the patient had right miniature thoracotomy for mitral valve replacement (29-mm [employer] mechanical prosthesis). Note preoperative cardiac catheterization showed no coronary disease. Patient readmitted May 2012 with fever. Blood cultures were negative. There was a small fluid collection in the anterior chest that was aspirated which was negative. Echocardiogram in May of 2012 showed normal LV function, biatrial enlargement, prosthetic mitral valve, moderate tricuspid regurgitation and mild aortic insufficiency. 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 The patient did have some increased dyspnea and pedal edema following discharge from her initial mitral valve replacement. Lasix was resumed and this resolved. I last saw her 5/23 and she noted recurrent fever. Blood cultures with final results pending. WBC - 15.3; ESR - 74. I discussed the patient with Dr Maurice March of ID last week and he agreed to arrange FU in his office. Since I last saw her, she states she had a fever to 101.9 on May 23. She has not had fever since then. However she has been taking Tylenol for her right breast pain. She denies dyspnea, congestion, cough, diarrhea, dysuria or chills. She does continue to have discomfort in her right breast. She also feels that her breast is hard.    Current Outpatient Prescriptions  Medication Sig Dispense Refill  . acetaminophen (TYLENOL) 500 MG tablet Take 500 mg by mouth every 6 (six) hours as needed.        Marland Kitchen albuterol (PROAIR HFA) 108 (90 BASE) MCG/ACT inhaler Inhale 2 puffs into the lungs as needed.        Marland Kitchen aspirin 81 MG EC tablet Take 81 mg by mouth daily.        . diphenhydrAMINE (BENADRYL) 25 MG tablet Take 25 mg by mouth at bedtime as needed.        Marland Kitchen FLUoxetine (PROZAC) 40 MG capsule Take 40 mg by mouth daily.        . fluticasone (FLONASE) 50 MCG/ACT nasal spray 2 sprays by Nasal route daily.  16 g  0  . furosemide (LASIX) 40 MG tablet 2 (two)  times daily. Take 1 1/2 tablet by mouth daily.      . iron polysaccharides (NIFEREX) 150 MG capsule Take 150 mg by mouth 1 dose over 46 hours.        Marland Kitchen KLOR-CON M20 20 MEQ tablet Take 20 mEq by mouth daily.       Marland Kitchen loratadine (CLARITIN) 10 MG tablet Take 10 mg by mouth daily.        . metoprolol tartrate (LOPRESSOR) 25 MG tablet Take by mouth. Take 3 tablets in the morning and 3 tablets in the evening      . omeprazole (PRILOSEC OTC) 20 MG tablet Take 20 mg by mouth daily.        Marland Kitchen warfarin (COUMADIN) 5 MG tablet Take 5 mg by mouth as directed.        Marland Kitchen DISCONTD: HYDROcodone-acetaminophen (VICODIN) 5-500 MG per tablet Take 1 tablet by mouth every 6 (six) hours as needed.           Past Medical History  Diagnosis Date  . Hypertension   . Hyperlipidemia   . GERD (gastroesophageal reflux disease)   . Depression   . Asthma   . Eczema   . History of rheumatic fever   . Right shoulder injury     history of  . Mitral stenosis with regurgitation     Past Surgical History  Procedure Date  . Cesarean  section 1991  . Abdominal hysterectomy 01/2004    partial  . Breast surgery 05/2007    left breast biposy  . Femur fracture surgery 1983    right  . 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 Tressie Stalker    History   Social History  . Marital Status: Single    Spouse Name: N/A    Number of Children: 3  . Years of Education: N/A   Occupational History  . SALES    Social History Main Topics  . Smoking status: Former Games developer  . Smokeless tobacco: Not on file   Comment: briefly as teenager  . Alcohol Use: No  . Drug Use: No  . Sexually Active: Not on file   Other Topics Concern  . Not on file   Social History Narrative   Regular exercise: yes    ROS: no chills, productive cough, hemoptysis, dysphasia, odynophagia, melena, hematochezia, dysuria, hematuria, rash, seizure activity, orthopnea, PND, pedal edema, claudication. Remaining systems  are negative.  Physical Exam: Well-developed well-nourished in no acute distress.  Skin is warm and dry.  HEENT is normal.  Neck is supple. No thyromegaly.  Chest is clear to auscultation with normal expansion.  Cardiovascular exam is regular rate and rhythm. Crisp mechanical valve sounds. Abdominal exam nontender or distended. No masses palpated. Extremities show no edema. neuro grossly intact No peripheral stigmata of SBE. Right breast examined. I can not palpate fluid collections. There is no warmth. There is some tenderness to palpation superior to the medial aspect of her right breast. Her thoracotomy incision is without evidence of infection.

## 2010-08-26 NOTE — Assessment & Plan Note (Signed)
Blood pressure elevated. Increase metoprolol to 100 mg p.o. B.i.d.

## 2010-08-26 NOTE — Assessment & Plan Note (Signed)
Patient continues to have fever. Her last temperature of 101.9 occurred on May 23. She has not had a fever since then but has been taking Tylenol for pain which certainly could mask fever. Previous evaluation unremarkable. Blood cultures were negative. Fluid collection in the anterior chest was aspirated and culture was negative. When I saw her last week her white blood cell count was 15 and her sed rate had decreased to 74. We are waiting final results of repeat blood cultures. I will plan to repeat a CAT scan of her chest to see if the fluid collection has enlarged or there is suggestion of infection. I discussed the patient with Dr. Maurice March today. Fever was previously felt secondary to CMV. However given the protracted course this seems unlikely. Dr. Maurice March has agreed to see the patient this week. Previous echocardiogram was unremarkable. I think endocarditis is less likely as her cultures have been negative. She may ultimately require TEE.

## 2010-08-26 NOTE — Assessment & Plan Note (Signed)
Euvolemic on examination. Continue present dose of Lasix. Previous potassium 3.2. Increase KCL to 20 mEq p.o. B.i.d. Check potassium and renal function in one week.

## 2010-08-26 NOTE — Assessment & Plan Note (Signed)
Management per primary care. 

## 2010-08-26 NOTE — Assessment & Plan Note (Signed)
Plan followup echocardiograms in the future and SBE prophylaxis.

## 2010-08-27 ENCOUNTER — Other Ambulatory Visit: Payer: Self-pay | Admitting: Cardiology

## 2010-08-27 ENCOUNTER — Telehealth: Payer: Self-pay | Admitting: *Deleted

## 2010-08-27 ENCOUNTER — Encounter: Payer: Self-pay | Admitting: Cardiology

## 2010-08-27 DIAGNOSIS — Z9889 Other specified postprocedural states: Secondary | ICD-10-CM

## 2010-08-27 NOTE — Telephone Encounter (Signed)
Addended by: Ellender Hose on: 08/27/2010 05:01 PM   Modules accepted: Orders

## 2010-08-27 NOTE — Telephone Encounter (Signed)
Spoke with pt. She states she has appointment with Dr. Maurice March tomorrow at 11:30

## 2010-08-27 NOTE — Telephone Encounter (Signed)
Spoke with pt. She was unable to reach Dr. Maurice March yesterday afternoon at number provided by Dr. Jens Som to schedule appointment.  She will try number again now.  I told her we would call  Her back in a couple of hours to see if she was able to schedule appointment.

## 2010-08-28 ENCOUNTER — Encounter: Payer: Self-pay | Admitting: Infectious Diseases

## 2010-08-28 ENCOUNTER — Inpatient Hospital Stay: Admission: RE | Admit: 2010-08-28 | Payer: Self-pay | Source: Ambulatory Visit

## 2010-08-28 ENCOUNTER — Encounter: Payer: Self-pay | Admitting: Cardiology

## 2010-08-28 ENCOUNTER — Ambulatory Visit (INDEPENDENT_AMBULATORY_CARE_PROVIDER_SITE_OTHER)
Admission: RE | Admit: 2010-08-28 | Discharge: 2010-08-28 | Disposition: A | Discharge status: Home / Self Care | Payer: Managed Care, Other (non HMO) | Source: Ambulatory Visit | Attending: Cardiology | Admitting: Cardiology

## 2010-08-28 ENCOUNTER — Ambulatory Visit (INDEPENDENT_AMBULATORY_CARE_PROVIDER_SITE_OTHER): Payer: Managed Care, Other (non HMO) | Admitting: Infectious Diseases

## 2010-08-28 VITALS — BP 165/105 | HR 96 | Temp 98.4°F | Ht 62.0 in | Wt 185.0 lb

## 2010-08-28 DIAGNOSIS — I38 Endocarditis, valve unspecified: Secondary | ICD-10-CM

## 2010-08-28 DIAGNOSIS — Z9889 Other specified postprocedural states: Secondary | ICD-10-CM

## 2010-08-28 DIAGNOSIS — R509 Fever, unspecified: Secondary | ICD-10-CM

## 2010-08-28 MED ORDER — IOHEXOL 300 MG/ML  SOLN
80.0000 mL | Freq: Once | INTRAMUSCULAR | Status: AC | PRN
  Administered 2010-08-28: 80 mL via INTRAVENOUS

## 2010-08-29 LAB — CBC WITH DIFFERENTIAL/PLATELET
Basophils Absolute: 0.1 10*3/uL (ref 0.0–0.1)
Basophils Relative: 1 % (ref 0–1)
Eosinophils Absolute: 0.2 10*3/uL (ref 0.0–0.7)
Eosinophils Relative: 2 % (ref 0–5)
MCH: 28.4 pg (ref 26.0–34.0)
MCHC: 29.7 g/dL — ABNORMAL LOW (ref 30.0–36.0)
MCV: 95.6 fL (ref 78.0–100.0)
Monocytes Absolute: 1.2 10*3/uL — ABNORMAL HIGH (ref 0.1–1.0)
Platelets: 759 10*3/uL — ABNORMAL HIGH (ref 150–400)
RDW: 15.5 % (ref 11.5–15.5)
WBC: 10 10*3/uL (ref 4.0–10.5)

## 2010-09-01 ENCOUNTER — Other Ambulatory Visit (INDEPENDENT_AMBULATORY_CARE_PROVIDER_SITE_OTHER): Payer: Managed Care, Other (non HMO) | Admitting: *Deleted

## 2010-09-01 ENCOUNTER — Ambulatory Visit (INDEPENDENT_AMBULATORY_CARE_PROVIDER_SITE_OTHER): Payer: Managed Care, Other (non HMO) | Admitting: *Deleted

## 2010-09-01 DIAGNOSIS — Z7901 Long term (current) use of anticoagulants: Secondary | ICD-10-CM

## 2010-09-01 DIAGNOSIS — I059 Rheumatic mitral valve disease, unspecified: Secondary | ICD-10-CM

## 2010-09-01 DIAGNOSIS — Z954 Presence of other heart-valve replacement: Secondary | ICD-10-CM

## 2010-09-01 DIAGNOSIS — I052 Rheumatic mitral stenosis with insufficiency: Secondary | ICD-10-CM

## 2010-09-01 DIAGNOSIS — R509 Fever, unspecified: Secondary | ICD-10-CM

## 2010-09-02 LAB — BASIC METABOLIC PANEL
BUN: 15 mg/dL (ref 6–23)
CO2: 27 mEq/L (ref 19–32)
Chloride: 101 mEq/L (ref 96–112)
Creatinine, Ser: 0.9 mg/dL (ref 0.4–1.2)
Potassium: 3.9 mEq/L (ref 3.5–5.1)

## 2010-09-15 ENCOUNTER — Other Ambulatory Visit: Payer: Self-pay | Admitting: Family

## 2010-09-17 ENCOUNTER — Ambulatory Visit: Payer: Managed Care, Other (non HMO) | Admitting: Cardiology

## 2010-09-25 ENCOUNTER — Encounter: Payer: Managed Care, Other (non HMO) | Admitting: *Deleted

## 2010-09-25 ENCOUNTER — Ambulatory Visit: Payer: Managed Care, Other (non HMO) | Admitting: Cardiology

## 2010-09-26 ENCOUNTER — Ambulatory Visit (INDEPENDENT_AMBULATORY_CARE_PROVIDER_SITE_OTHER): Payer: Managed Care, Other (non HMO) | Admitting: *Deleted

## 2010-09-26 DIAGNOSIS — I052 Rheumatic mitral stenosis with insufficiency: Secondary | ICD-10-CM

## 2010-09-26 DIAGNOSIS — Z7901 Long term (current) use of anticoagulants: Secondary | ICD-10-CM

## 2010-09-26 DIAGNOSIS — Z954 Presence of other heart-valve replacement: Secondary | ICD-10-CM

## 2010-09-26 LAB — POCT INR: INR: 2

## 2010-09-26 MED ORDER — WARFARIN SODIUM 5 MG PO TABS: ORAL_TABLET | ORAL | Status: DC

## 2010-09-30 ENCOUNTER — Encounter: Payer: Self-pay | Admitting: Cardiology

## 2010-10-03 ENCOUNTER — Other Ambulatory Visit: Payer: Self-pay | Admitting: Thoracic Surgery (Cardiothoracic Vascular Surgery)

## 2010-10-03 DIAGNOSIS — I059 Rheumatic mitral valve disease, unspecified: Secondary | ICD-10-CM

## 2010-10-03 DIAGNOSIS — I050 Rheumatic mitral stenosis: Secondary | ICD-10-CM

## 2010-10-06 ENCOUNTER — Ambulatory Visit
Admission: RE | Admit: 2010-10-06 | Discharge: 2010-10-06 | Disposition: A | Discharge status: Home / Self Care | Payer: Managed Care, Other (non HMO) | Source: Ambulatory Visit | Attending: Thoracic Surgery (Cardiothoracic Vascular Surgery) | Admitting: Thoracic Surgery (Cardiothoracic Vascular Surgery)

## 2010-10-06 ENCOUNTER — Encounter: Payer: Self-pay | Admitting: Thoracic Surgery (Cardiothoracic Vascular Surgery)

## 2010-10-06 ENCOUNTER — Encounter: Payer: Managed Care, Other (non HMO) | Admitting: Thoracic Surgery (Cardiothoracic Vascular Surgery)

## 2010-10-06 ENCOUNTER — Ambulatory Visit (INDEPENDENT_AMBULATORY_CARE_PROVIDER_SITE_OTHER): Payer: Self-pay | Admitting: Thoracic Surgery (Cardiothoracic Vascular Surgery)

## 2010-10-06 DIAGNOSIS — Z954 Presence of other heart-valve replacement: Secondary | ICD-10-CM

## 2010-10-06 DIAGNOSIS — Z09 Encounter for follow-up examination after completed treatment for conditions other than malignant neoplasm: Secondary | ICD-10-CM

## 2010-10-06 DIAGNOSIS — Z952 Presence of prosthetic heart valve: Secondary | ICD-10-CM

## 2010-10-06 DIAGNOSIS — I059 Rheumatic mitral valve disease, unspecified: Secondary | ICD-10-CM

## 2010-10-06 DIAGNOSIS — I050 Rheumatic mitral stenosis: Secondary | ICD-10-CM

## 2010-10-06 NOTE — Progress Notes (Signed)
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 She was last seen here in the office on 08/18/2010. She did have some recurrent low-grade fevers for which she was seen in followup by Dr. Jens Som and Dr. Maurice March in May. However, her fevers had since then completely resolved. The patient reports that she has been doing very well recently. She still has mild residual pain in her right chest and underneath the right breast. The pain does not bother her much, and overall the patient is doing well. She does report that over the last few days she's had some mild dizziness when she stands up. She otherwise has no complaints.  On physical exam the patient looks quite good. The surgical scars have healed nicely. Her breath sounds are clear to auscultation and symmetrical bilaterally. Heart sounds are crisp and noticeable from the mechanical heart sounds. No murmurs no rubs are noted. Extremities are warm and well-perfused. There is no lower extremity edema.  Chest x-ray performed today is reviewed. Lung fields are clear. No pleural effusions are noted.   Overall the patient seems to be doing quite well. Her previous problem with low-grade fevers seems to have resolved. Mild right-sided chest discomfort is expected.  This should continue to gradually resolve. We will plan to see her back in 3 months. At that time we will obtain a followup CT scan to make sure that the pleural fluid collection has resolved. I have suggested that she may need to cut back on her dose of Lasix. All of her questions have been answered.

## 2010-10-10 ENCOUNTER — Ambulatory Visit (INDEPENDENT_AMBULATORY_CARE_PROVIDER_SITE_OTHER): Payer: Managed Care, Other (non HMO) | Admitting: *Deleted

## 2010-10-10 DIAGNOSIS — I052 Rheumatic mitral stenosis with insufficiency: Secondary | ICD-10-CM

## 2010-10-10 DIAGNOSIS — Z7901 Long term (current) use of anticoagulants: Secondary | ICD-10-CM

## 2010-10-10 DIAGNOSIS — Z954 Presence of other heart-valve replacement: Secondary | ICD-10-CM

## 2010-10-10 LAB — POCT INR: INR: 1.9

## 2010-10-16 ENCOUNTER — Ambulatory Visit: Payer: Managed Care, Other (non HMO) | Admitting: Cardiology

## 2010-10-21 ENCOUNTER — Encounter: Payer: Self-pay | Admitting: Cardiology

## 2010-10-22 ENCOUNTER — Ambulatory Visit: Payer: Managed Care, Other (non HMO) | Admitting: Cardiology

## 2010-10-22 ENCOUNTER — Ambulatory Visit: Payer: Managed Care, Other (non HMO) | Admitting: Family

## 2010-10-22 ENCOUNTER — Ambulatory Visit (INDEPENDENT_AMBULATORY_CARE_PROVIDER_SITE_OTHER): Payer: Managed Care, Other (non HMO) | Admitting: Cardiology

## 2010-10-22 ENCOUNTER — Encounter: Payer: Self-pay | Admitting: Cardiology

## 2010-10-22 VITALS — BP 128/84 | HR 88 | Resp 18 | Ht 61.0 in | Wt 179.1 lb

## 2010-10-22 DIAGNOSIS — Z952 Presence of prosthetic heart valve: Secondary | ICD-10-CM

## 2010-10-22 DIAGNOSIS — E785 Hyperlipidemia, unspecified: Secondary | ICD-10-CM

## 2010-10-22 DIAGNOSIS — Z9889 Other specified postprocedural states: Secondary | ICD-10-CM

## 2010-10-22 DIAGNOSIS — Z954 Presence of other heart-valve replacement: Secondary | ICD-10-CM

## 2010-10-22 DIAGNOSIS — I10 Essential (primary) hypertension: Secondary | ICD-10-CM

## 2010-10-22 NOTE — Assessment & Plan Note (Signed)
Blood pressure controlled. Continue present medications. 

## 2010-10-22 NOTE — Assessment & Plan Note (Signed)
Patient much improved symptomatically. Her fevers have completely resolved. Previous evaluation negative. Dr. Cornelius Moras plans repeat CT scan in the near future to make sure that her previous fluid collection has resolved. 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 Continue SBE prophylaxis. She does not appear to be volume overloaded on examination today. Decrease Lasix to 60 mg daily and potassium to 20 meq daily. Check potassium and renal function in one week. If she has worsening dyspnea or edema we will resume previous dose. I would like to wean diuretics if possible.

## 2010-10-22 NOTE — Assessment & Plan Note (Signed)
Management per primary care. 

## 2010-10-22 NOTE — Progress Notes (Signed)
HPI: Pleasant female for f/u of MS/MR. On July 17, 2010 the patient had right miniature thoracotomy for mitral valve replacement (29-mm [employer] mechanical prosthesis). Note preoperative cardiac catheterization showed no coronary disease. Patient readmitted May 2012 with fever. Blood cultures were negative. There was a small fluid collection in the anterior chest that was aspirated which was negative. Echocardiogram in May of 2012 showed normal LV function, biatrial enlargement, prosthetic mitral valve, moderate tricuspid regurgitation and mild aortic insufficiency. 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 The patient did have recurrent fevers but workup negative. This included an evaluation by Dr. Maurice March. Followup chest CT in May of 2012 showed no change in her fluid collection. Since that time she feels much better. She has had no recurrent fevers or chills. The soreness in her chest has resolved. She denies dyspnea, chest pain, palpitations or syncope.  Current Outpatient Prescriptions  Medication Sig Dispense Refill  . acetaminophen (TYLENOL) 500 MG tablet Take 500 mg by mouth every 6 (six) hours as needed.        Marland Kitchen aspirin 81 MG EC tablet Take 81 mg by mouth daily.        . diphenhydrAMINE (BENADRYL) 25 MG tablet Take 25 mg by mouth at bedtime as needed.        Marland Kitchen FLUoxetine (PROZAC) 40 MG capsule TAKE ONE CAPSULE BY MOUTH EVERY DAY  30 capsule  1  . fluticasone (FLONASE) 50 MCG/ACT nasal spray 2 sprays by Nasal route daily.  16 g  0  . furosemide (LASIX) 40 MG tablet 2 (two) times daily. Take 1 1/2 tablet by mouth daily.      Marland Kitchen HYDROcodone-acetaminophen (VICODIN) 5-500 MG per tablet Take 1 tablet by mouth every 6 (six) hours as needed.        . loratadine (CLARITIN) 10 MG tablet Take 10 mg by mouth daily.        . metoprolol (LOPRESSOR) 100 MG tablet Take 1 tablet (100 mg total) by mouth 2 (two) times daily.  60 tablet  11  . omeprazole (PRILOSEC OTC) 20 MG tablet Take 20 mg by mouth daily.        .  potassium chloride SA (KLOR-CON M20) 20 MEQ tablet Take 1 tablet by mouth twice a day.  60 tablet  6  . warfarin (COUMADIN) 5 MG tablet Take as directed by Anticoagulation clinic   40 tablet  3     Past Medical History  Diagnosis Date  . Hypertension   . Hyperlipidemia   . GERD (gastroesophageal reflux disease)   . Depression   . Asthma   . Eczema   . History of rheumatic fever   . Right shoulder injury     history of  . Mitral stenosis with regurgitation     Past Surgical History  Procedure Date  . Cesarean section 1991  . Abdominal hysterectomy 01/2004    partial  . Breast surgery 05/2007    left breast biposy  . Femur fracture surgery 1983    right  . 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 Tressie Stalker    History   Social History  . Marital Status: Single    Spouse Name: N/A    Number of Children: 3  . Years of Education: N/A   Occupational History  . SALES    Social History Main Topics  . Smoking status: Former Games developer  . Smokeless tobacco: Not on file   Comment: briefly as teenager  . Alcohol  Use: No  . Drug Use: No  . Sexually Active: Not on file   Other Topics Concern  . Not on file   Social History Narrative   Regular exercise: yes    ROS: no fevers or chills, productive cough, hemoptysis, dysphasia, odynophagia, melena, hematochezia, dysuria, hematuria, rash, seizure activity, orthopnea, PND, pedal edema, claudication. Remaining systems are negative.  Physical Exam: Well-developed well-nourished in no acute distress.  Skin is warm and dry.  HEENT is normal.  Neck is supple. No thyromegaly.  Chest is clear to auscultation with normal expansion.  Cardiovascular exam is regular rate and rhythm. Crisp mechanical valve sound Abdominal exam nontender or distended. No masses palpated. Extremities show no edema. neuro grossly intact

## 2010-10-22 NOTE — Patient Instructions (Signed)
Your physician recommends that you schedule a follow-up appointment in: 3 months  Decrease lasix to once daily  Decrease potassium to once daily  Your physician recommends that you return for lab work in: one week after decreasing meds

## 2010-10-24 ENCOUNTER — Encounter: Payer: Managed Care, Other (non HMO) | Admitting: *Deleted

## 2010-10-27 ENCOUNTER — Encounter: Payer: Self-pay | Admitting: Family

## 2010-10-27 ENCOUNTER — Ambulatory Visit (INDEPENDENT_AMBULATORY_CARE_PROVIDER_SITE_OTHER): Payer: Managed Care, Other (non HMO) | Admitting: Family

## 2010-10-27 DIAGNOSIS — G8929 Other chronic pain: Secondary | ICD-10-CM

## 2010-10-27 DIAGNOSIS — Z952 Presence of prosthetic heart valve: Secondary | ICD-10-CM

## 2010-10-27 DIAGNOSIS — M25519 Pain in unspecified shoulder: Secondary | ICD-10-CM

## 2010-10-27 MED ORDER — TRAMADOL HCL 50 MG PO TABS: 50.0000 mg | ORAL_TABLET | Freq: Four times a day (QID) | ORAL | Status: DC | PRN

## 2010-10-27 NOTE — Assessment & Plan Note (Signed)
Clinically stable.  Coumadin is being managed by the coumadin clinic. She follows with Dr. Cornelius Moras and Dr. Jens Som. No further fevers.

## 2010-10-27 NOTE — Patient Instructions (Addendum)
Please schedule a complete physical fasting early this fall.   You will be contacted about your appointment with Dr. Pearletha Forge.

## 2010-10-27 NOTE — Assessment & Plan Note (Signed)
Advised her against use of NSAIDS.  Will plan to treat short term with tramadol as tylenol is not effective. Refer to Dr. Pearletha Forge for further evaluation.

## 2010-10-27 NOTE — Progress Notes (Signed)
Subjective:    Patient ID: Julia Garza, female    DOB: [birth year], 50 y.o.   MRN: [id number]  HPI  [name] is a 49 year old female with history of rheumatic heart diease and subsequent mitral valve disease/CHF who presents today for hospital follow up. She is s/p MVR on 4/19 with a mechanical valve.  Post operative course was complicated by fever. 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 She has not had a fever since May.  Now exercising.  On coumadin- followed by coumadin clinic. Feels great, denies shortness of breath or LE edema.  R shoulder pain- "all the time."  Throbbing/aching pain.  + joint pain in the shoulder.  + numbness.  Worse after extensive movement.   Review of Systems See HPI  Past Medical History  Diagnosis Date  . Hypertension   . Hyperlipidemia   . GERD (gastroesophageal reflux disease)   . Depression   . Asthma   . Eczema   . History of rheumatic fever   . Right shoulder injury     history of  . Mitral stenosis with regurgitation     History   Social History  . Marital Status: Single    Spouse Name: N/A    Number of Children: 3  . Years of Education: N/A   Occupational History  . SALES    Social History Main Topics  . Smoking status: Former Games developer  . Smokeless tobacco: Not on file   Comment: briefly as teenager  . Alcohol Use: No  . Drug Use: No  . Sexually Active: Not on file   Other Topics Concern  . Not on file   Social History Narrative   Regular exercise: yes    Past Surgical History  Procedure Date  . Cesarean section 1991  . Abdominal hysterectomy 01/2004    partial  . Breast surgery 05/2007    left breast biposy  . Femur fracture surgery 1983    right  . 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 Tressie Stalker    Family History  Problem Relation Age of  Onset  . Heart disease Mother   . Kidney disease Mother   . Diabetes Mother   . Thyroid disease Mother   . Colon polyps Mother   . Heart attack Mother   . Hypertension Mother   . Heart disease Father   . Heart attack Father     No Known Allergies  Current Outpatient Prescriptions on File Prior to Visit  Medication Sig Dispense Refill  . acetaminophen (TYLENOL) 500 MG tablet Take 500 mg by mouth every 6 (six) hours as needed.        Marland Kitchen aspirin 81 MG EC tablet Take 81 mg by mouth daily.        . diphenhydrAMINE (BENADRYL) 25 MG tablet Take 25 mg by mouth at bedtime as needed.        Marland Kitchen FLUoxetine (PROZAC) 40 MG capsule TAKE ONE CAPSULE BY MOUTH EVERY DAY  30 capsule  1  . fluticasone (FLONASE) 50 MCG/ACT nasal spray 2 sprays by Nasal route daily.  16 g  0  . furosemide (LASIX) 40 MG tablet Take 1 1/2 tablet by mouth daily.      Marland Kitchen loratadine (CLARITIN) 10 MG tablet Take 10 mg by mouth daily.        Marland Kitchen  metoprolol (LOPRESSOR) 100 MG tablet Take 1 tablet (100 mg total) by mouth 2 (two) times daily.  60 tablet  11  . omeprazole (PRILOSEC OTC) 20 MG tablet Take 20 mg by mouth daily.        . potassium chloride SA (KLOR-CON M20) 20 MEQ tablet Take 1 tablet by mouth twice a day.  60 tablet  6  . warfarin (COUMADIN) 5 MG tablet Take as directed by Anticoagulation clinic   40 tablet  3    BP 112/80  Pulse 84  Temp(Src) 97.9 F (36.6 C) (Oral)  Resp 16  Ht 5\' 2"  (1.575 m)  Wt 178 lb (80.74 kg)  BMI 32.56 kg/m2       Objective:   Physical Exam  Constitutional: She appears well-developed and well-nourished.  Cardiovascular: Normal rate and regular rhythm.   No murmur heard.      + mechanical valve "click" noted  Pulmonary/Chest: Effort normal and breath sounds normal. No respiratory distress. She has no wheezes. She has no rales.  Abdominal: Soft.  Musculoskeletal: Normal range of motion.       + pain with ROM of right shoulder.  No swelling.    Psychiatric: She has a normal mood  and affect. Her behavior is normal. Thought content normal.          Assessment & Plan:   No problem-specific assessment & plan notes found for this encounter.

## 2010-10-28 ENCOUNTER — Ambulatory Visit (INDEPENDENT_AMBULATORY_CARE_PROVIDER_SITE_OTHER): Payer: Managed Care, Other (non HMO) | Admitting: *Deleted

## 2010-10-28 DIAGNOSIS — Z7901 Long term (current) use of anticoagulants: Secondary | ICD-10-CM

## 2010-10-28 DIAGNOSIS — Z954 Presence of other heart-valve replacement: Secondary | ICD-10-CM

## 2010-10-28 DIAGNOSIS — I052 Rheumatic mitral stenosis with insufficiency: Secondary | ICD-10-CM

## 2010-10-29 ENCOUNTER — Ambulatory Visit: Payer: Managed Care, Other (non HMO) | Admitting: Family Medicine

## 2010-11-04 ENCOUNTER — Telehealth: Payer: Self-pay | Admitting: Cardiology

## 2010-11-04 NOTE — Telephone Encounter (Signed)
Per pt call, pt came in the office last Wednesday to see Dr. Jens Som and pt was given some new instructions for pt medications. Pt however, has lost those instructions. Pt would like to know if these instructions can be faxed to her. Please return pt call with any questions.   Please fax to [phone]

## 2010-11-04 NOTE — Telephone Encounter (Signed)
Spoke with pt, she has miss placed the orders for the lab work she needs next week. Will print order and mail to pt Julia Garza

## 2010-11-05 ENCOUNTER — Ambulatory Visit: Payer: Managed Care, Other (non HMO) | Admitting: Family Medicine

## 2010-11-07 ENCOUNTER — Telehealth: Payer: Self-pay | Admitting: *Deleted

## 2010-11-07 NOTE — Telephone Encounter (Signed)
Received records from [facility] care and forwarded to provider for review.

## 2010-11-11 ENCOUNTER — Encounter: Payer: Managed Care, Other (non HMO) | Admitting: *Deleted

## 2010-11-14 ENCOUNTER — Ambulatory Visit (INDEPENDENT_AMBULATORY_CARE_PROVIDER_SITE_OTHER): Payer: Managed Care, Other (non HMO) | Admitting: Family Medicine

## 2010-11-14 ENCOUNTER — Encounter: Payer: Self-pay | Admitting: Family

## 2010-11-14 VITALS — BP 104/68 | HR 80 | Ht 61.0 in | Wt 182.0 lb

## 2010-11-14 DIAGNOSIS — M25511 Pain in right shoulder: Secondary | ICD-10-CM

## 2010-11-14 DIAGNOSIS — M25519 Pain in unspecified shoulder: Secondary | ICD-10-CM

## 2010-11-14 NOTE — Patient Instructions (Signed)
You have rotator cuff impingement, subacromial bursitis of your right shoulder. Try to avoid painful activities (overhead activities, lifting with extended arm) as much as possible. Tramadol and tylenol as needed for pain Subacromial injection may be beneficial to help with pain and to decrease inflammation- you were given this today. If not improved after 1 week, I would recommend you start physical therapy 1-2 times a week for rotator cuff strengthening. Follow up with me in 1 month or as needed. If not improving at follow-up we will consider ultrasound and/or physical therapy.

## 2010-11-15 ENCOUNTER — Encounter: Payer: Self-pay | Admitting: Family Medicine

## 2010-11-15 NOTE — Assessment & Plan Note (Addendum)
Right shoulder rotator cuff impingement - cortisone injection given today.  Is on coumadin for mechanical valve so will avoid oral NSAIDs.  Tylenol, tramadol as needed for pain.  Shown home RTC strengthening exercises and handout provided, theraband provided.  If not improving as expected, consider ultrasound.  She will also call us with name of physician she saw in [place] and we'll try to obtain her old MRI report.  After informed written consent, patient was seated on exam table. Right shoulder was prepped with alcohol swab and utilizing posterior approach, patient's right subacromial space was injected with 3:1 marcaine: depomedrol. Patient tolerated the procedure well without immediate complications.
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
## 2010-11-17 ENCOUNTER — Other Ambulatory Visit: Payer: Self-pay | Admitting: Family

## 2010-11-18 NOTE — Telephone Encounter (Signed)
Rx sent to pharmacy. She should call orthopedic MD if symptoms are not improved at the 1 week mark.

## 2010-11-18 NOTE — Telephone Encounter (Signed)
Pt notified and voices understanding. She reports that she is supposed to start physical therapy on her shoulder once the injection takes affect and will let orthopedic know if symptoms worsen or do not improve.

## 2010-11-18 NOTE — Telephone Encounter (Signed)
Received message from pt that she saw the orthopedic and got a cortisone injection in her arm but was told that she would not see the effects for a week. Pt states she is still in a lot of pain and is requesting a refill of Tramadol. Please advise?

## 2010-11-20 ENCOUNTER — Other Ambulatory Visit: Payer: Self-pay | Admitting: Family

## 2010-11-21 NOTE — Telephone Encounter (Signed)
Prozac 40mg  1 tablet daily. #30 x 1 refill sent to [facility].

## 2010-11-24 ENCOUNTER — Telehealth: Payer: Self-pay | Admitting: *Deleted

## 2010-11-24 MED ORDER — OMEPRAZOLE 20 MG PO CPDR: 20.0000 mg | DELAYED_RELEASE_CAPSULE | Freq: Every day | ORAL | Status: AC

## 2010-11-24 NOTE — Telephone Encounter (Signed)
Received message from pt requesting rx for Prilosec. States it will be cheaper through her insurance ($4). Please advise.

## 2010-11-24 NOTE — Telephone Encounter (Signed)
Notified pt. She states that she just spoke with [facility] and was told the cost was going to be $24. Advised pt to call [facility] re: pricing and pt voices understanding.

## 2010-11-24 NOTE — Telephone Encounter (Signed)
Rx sent to pharmacy   

## 2010-11-28 ENCOUNTER — Ambulatory Visit (INDEPENDENT_AMBULATORY_CARE_PROVIDER_SITE_OTHER): Payer: Managed Care, Other (non HMO) | Admitting: *Deleted

## 2010-11-28 DIAGNOSIS — Z7901 Long term (current) use of anticoagulants: Secondary | ICD-10-CM

## 2010-11-28 DIAGNOSIS — I052 Rheumatic mitral stenosis with insufficiency: Secondary | ICD-10-CM

## 2010-11-28 DIAGNOSIS — Z954 Presence of other heart-valve replacement: Secondary | ICD-10-CM

## 2010-11-28 LAB — POCT INR: INR: 2.1

## 2010-12-03 ENCOUNTER — Ambulatory Visit (INDEPENDENT_AMBULATORY_CARE_PROVIDER_SITE_OTHER): Payer: Managed Care, Other (non HMO) | Admitting: Family

## 2010-12-03 ENCOUNTER — Other Ambulatory Visit: Payer: Self-pay | Admitting: Cardiology

## 2010-12-03 ENCOUNTER — Encounter: Payer: Self-pay | Admitting: Family

## 2010-12-03 VITALS — BP 142/94 | HR 72 | Temp 97.7°F | Resp 16 | Ht 62.0 in | Wt 181.0 lb

## 2010-12-03 DIAGNOSIS — R232 Flushing: Secondary | ICD-10-CM

## 2010-12-03 DIAGNOSIS — I509 Heart failure, unspecified: Secondary | ICD-10-CM

## 2010-12-03 DIAGNOSIS — N951 Menopausal and female climacteric states: Secondary | ICD-10-CM

## 2010-12-03 DIAGNOSIS — F329 Major depressive disorder, single episode, unspecified: Secondary | ICD-10-CM

## 2010-12-03 MED ORDER — VENLAFAXINE HCL 37.5 MG PO TABS: 37.5000 mg | ORAL_TABLET | Freq: Two times a day (BID) | ORAL | Status: DC

## 2010-12-03 MED ORDER — VENLAFAXINE HCL ER 37.5 MG PO CP24: 37.5000 mg | ORAL_CAPSULE | Freq: Every day | ORAL | Status: DC

## 2010-12-03 NOTE — Assessment & Plan Note (Signed)
Will repeat hormonal studies.  Trial of effexor for depression and hopefully also will help with hot flashes.

## 2010-12-03 NOTE — Assessment & Plan Note (Signed)
She appears euvolemic today.  I recommended that she decrease the lasix to 60mg  once daily. Call if she develops SOB or swelling.

## 2010-12-03 NOTE — Assessment & Plan Note (Signed)
Deteriorated. Will give pt trial of Effexor in addition to Prozac.  Rx for extended release effexor was cancelled at pharmacy.

## 2010-12-03 NOTE — Patient Instructions (Signed)
Complete your lab work on the first floor.  Follow up in 1 month.

## 2010-12-03 NOTE — Progress Notes (Signed)
Subjective:    Patient ID: Julia Garza, female    DOB: [birth year], 50 y.o.   MRN: [id number]  HPI  Julia Garza is a 50 yr old female who presents today for evaluation of hot flashes- started about 2 months ago.  She reports that she starts sweating at rest during the day.  Patient is s/p partial hysterectomy 2005.  Ovaries are intact.  Had hormone studies performed in March which did not show that she is menopausal.  Lasix- she reports that she is taking 40mg  twice daily instead of 60mg  once daily per Dr. Ludwig Clarks instructions.  Denies sob, or swelling.  Depression- reports stress with medical bills.  Sleep is poor.  Tries benadryl without improvement.  Worries all the time. Mom was diagnosed with alzheimers.  Lays in the bed "all day long." No motivation. Continues prozac, but does not feel like it is helping.  HTN- using sudafed PE. She thinks that this may be increasing her blood pressure.  Reports that flonase has not been holding her.  Review of Systems See HPi  Past Medical History  Diagnosis Date  . Hypertension   . Hyperlipidemia   . GERD (gastroesophageal reflux disease)   . Depression   . Asthma   . Eczema   . History of rheumatic fever   . Right shoulder injury     history of  . Mitral stenosis with regurgitation   . Syphilis prior 2006    Pt was treated for + RPR/titer    History   Social History  . Marital Status: Single    Spouse Name: N/A    Number of Children: 3  . Years of Education: N/A   Occupational History  . SALES    Social History Main Topics  . Smoking status: Former Games developer  . Smokeless tobacco: Not on file   Comment: briefly as teenager  . Alcohol Use: No  . Drug Use: No  . Sexually Active: Not on file   Other Topics Concern  . Not on file   Social History Narrative   Regular exercise: yes    Past Surgical History  Procedure Date  . Cesarean section 1991  . Abdominal hysterectomy 01/2004    partial  . Breast surgery 05/2007   left breast biposy  . Femur fracture surgery 1983    right  . 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 Tressie Stalker    Family History  Problem Relation Age of Onset  . Heart disease Mother   . Kidney disease Mother   . Diabetes Mother   . Thyroid disease Mother   . Colon polyps Mother   . Heart attack Mother   . Hypertension Mother   . Heart disease Father   . Heart attack Father     No Known Allergies  Current Outpatient Prescriptions on File Prior to Visit  Medication Sig Dispense Refill  . acetaminophen (TYLENOL) 500 MG tablet Take 500 mg by mouth every 6 (six) hours as needed.        Marland Kitchen aspirin 81 MG EC tablet Take 81 mg by mouth daily.        . diphenhydrAMINE (BENADRYL) 25 MG tablet Take 25 mg by mouth at bedtime as needed.        Marland Kitchen FLUoxetine (PROZAC) 40 MG capsule TAKE ONE CAPSULE BY MOUTH EVERY DAY  30 capsule  1  . fluticasone (FLONASE) 50 MCG/ACT nasal spray 2 sprays by Nasal route daily.  16  g  0  . furosemide (LASIX) 40 MG tablet Take 1 1/2 tablet by mouth daily.      Marland Kitchen loratadine (CLARITIN) 10 MG tablet Take 10 mg by mouth daily.        . metoprolol (LOPRESSOR) 100 MG tablet Take 1 tablet (100 mg total) by mouth 2 (two) times daily.  60 tablet  11  . omeprazole (PRILOSEC) 20 MG capsule Take 1 capsule (20 mg total) by mouth daily.  30 capsule  5  . potassium chloride SA (KLOR-CON M20) 20 MEQ tablet Take 1 tablet by mouth twice a day.  60 tablet  6  . warfarin (COUMADIN) 5 MG tablet Take as directed by Anticoagulation clinic   40 tablet  3    BP 142/94  Pulse 72  Temp(Src) 97.7 F (36.5 C) (Oral)  Resp 16  Ht 5\' 2"  (1.575 m)  Wt 181 lb 0.6 oz (82.119 kg)  BMI 33.11 kg/m2       Objective:   Physical Exam  Constitutional: She appears well-developed and well-nourished.  Cardiovascular: Normal rate and regular rhythm.   No murmur heard.      Mechanical valve click is noted.  Pulmonary/Chest: Effort normal and breath  sounds normal. No respiratory distress. She has no wheezes. She has no rales. She exhibits no tenderness.  Musculoskeletal: She exhibits no edema.  Psychiatric: She has a normal mood and affect. Her behavior is normal. Judgment and thought content normal.          Assessment & Plan:  25 minutes spent with pt today.  >50% of this time was spent counseling pt on her depression.  She was also instructed to stop sudafed products due to elevated BP and use plain claritin once daily + flonase for nasal congestion.

## 2010-12-04 ENCOUNTER — Telehealth: Payer: Self-pay | Admitting: Family

## 2010-12-04 LAB — LUTEINIZING HORMONE: LH: 61.9 m[IU]/mL

## 2010-12-04 LAB — FOLLICLE STIMULATING HORMONE: FSH: 81.6 m[IU]/mL

## 2010-12-04 LAB — BASIC METABOLIC PANEL
CO2: 28 mEq/L (ref 19–32)
Calcium: 10.4 mg/dL (ref 8.4–10.5)
Creat: 0.94 mg/dL (ref 0.50–1.10)

## 2010-12-04 NOTE — Telephone Encounter (Signed)
Please let pt know that her estrogen level is low.  Hormone studies look like the beginnings of menopause.  We will see how she feels on the effexor and review at her upcoming apt in 1 month.

## 2010-12-04 NOTE — Telephone Encounter (Signed)
Pt.notified

## 2010-12-08 ENCOUNTER — Other Ambulatory Visit: Payer: Self-pay | Admitting: Thoracic Surgery (Cardiothoracic Vascular Surgery)

## 2010-12-11 ENCOUNTER — Telehealth: Payer: Self-pay | Admitting: *Deleted

## 2010-12-11 MED ORDER — ZOLPIDEM TARTRATE 5 MG PO TABS: 5.0000 mg | ORAL_TABLET | Freq: Every evening | ORAL | Status: DC | PRN

## 2010-12-11 NOTE — Telephone Encounter (Signed)
Received message from pt that she has been on Effexor x 1 week and has no improvement in her not flashes or insomnia. Pt is requesting med to help her sleep? Please advise.

## 2010-12-11 NOTE — Telephone Encounter (Signed)
Please send rx to her pharmacy for ambien 5mg  PO HS PRN insomnia #30 zero refills.

## 2010-12-11 NOTE — Telephone Encounter (Signed)
Pt notified and Rx left on pharmacy voicemail as stated below.

## 2010-12-17 ENCOUNTER — Ambulatory Visit (INDEPENDENT_AMBULATORY_CARE_PROVIDER_SITE_OTHER): Payer: Managed Care, Other (non HMO) | Admitting: Family

## 2010-12-17 ENCOUNTER — Encounter: Payer: Self-pay | Admitting: Family

## 2010-12-17 DIAGNOSIS — J329 Chronic sinusitis, unspecified: Secondary | ICD-10-CM

## 2010-12-17 MED ORDER — AMOXICILLIN 500 MG PO CAPS: 1000.0000 mg | ORAL_CAPSULE | Freq: Three times a day (TID) | ORAL | Status: AC

## 2010-12-17 NOTE — Patient Instructions (Signed)
Call if symptoms worsen, if you develop fever or if you are not feeling better in 2-3 days. Please arrange follow up with your dentist.

## 2010-12-17 NOTE — Assessment & Plan Note (Signed)
Will plan to treat with amoxicillin x 10 days.  This should also give coverage for ? Early L otitis media and ? Early tooth abscess.   She is instructed to contact her dentist for further evaluation.

## 2010-12-17 NOTE — Progress Notes (Signed)
Subjective:    Patient ID: Julia Garza, female    DOB: 1960/05/07, 50 y.o.   MRN: [id number]  HPI  Ms.  Argote is a 50 yr old female who presents today with chief complaint of sinus pressure.  Symptoms started 6 days ago.  She has associated left ear pain and left neck soreness.  She notes some associated left gum soreness on the left as well and questions an abscessed tooth.  Occasional cough productive of clear mucous.  She has used flonase and used tylenol for headache.    Review of Systems See HPI  Past Medical History  Diagnosis Date  . Hypertension   . Hyperlipidemia   . GERD (gastroesophageal reflux disease)   . Depression   . Asthma   . Eczema   . History of rheumatic fever   . Right shoulder injury     history of  . Mitral stenosis with regurgitation   . Syphilis prior 2006    Pt was treated for + RPR/titer    History   Social History  . Marital Status: Single    Spouse Name: N/A    Number of Children: 3  . Years of Education: N/A   Occupational History  . SALES    Social History Main Topics  . Smoking status: Former Games developer  . Smokeless tobacco: Not on file   Comment: briefly as teenager  . Alcohol Use: No  . Drug Use: No  . Sexually Active: Not on file   Other Topics Concern  . Not on file   Social History Narrative   Regular exercise: yes    Past Surgical History  Procedure Date  . Cesarean section 1991  . Abdominal hysterectomy 01/2004    partial  . Breast surgery 05/2007    left breast biposy  . Femur fracture surgery 1983    right  . 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 Tressie Stalker    Family History  Problem Relation Age of Onset  . Heart disease Mother   . Kidney disease Mother   . Diabetes Mother   . Thyroid disease Mother   . Colon polyps Mother   . Heart attack Mother   . Hypertension Mother   . Heart disease Father   . Heart attack Father     No Known Allergies  Current  Outpatient Prescriptions on File Prior to Visit  Medication Sig Dispense Refill  . acetaminophen (TYLENOL) 500 MG tablet Take 500 mg by mouth every 6 (six) hours as needed.        Marland Kitchen aspirin 81 MG EC tablet Take 81 mg by mouth daily.        . diphenhydrAMINE (BENADRYL) 25 MG tablet Take 25 mg by mouth at bedtime as needed.        Marland Kitchen FLUoxetine (PROZAC) 40 MG capsule TAKE ONE CAPSULE BY MOUTH EVERY DAY  30 capsule  1  . fluticasone (FLONASE) 50 MCG/ACT nasal spray 2 sprays by Nasal route daily.  16 g  0  . furosemide (LASIX) 40 MG tablet Take 1 1/2 tablet by mouth daily.      Marland Kitchen loratadine (CLARITIN) 10 MG tablet Take 10 mg by mouth daily.        . metoprolol (LOPRESSOR) 100 MG tablet Take 1 tablet (100 mg total) by mouth 2 (two) times daily.  60 tablet  11  . omeprazole (PRILOSEC) 20 MG capsule Take 1 capsule (20 mg total) by mouth daily.  30 capsule  5  . potassium chloride SA (KLOR-CON M20) 20 MEQ tablet Take 1 tablet by mouth twice a day.  60 tablet  6  . venlafaxine (EFFEXOR) 37.5 MG tablet Take 1 tablet (37.5 mg total) by mouth 2 (two) times daily.  60 tablet  1  . warfarin (COUMADIN) 5 MG tablet Take as directed by Anticoagulation clinic   40 tablet  3  . zolpidem (AMBIEN) 5 MG tablet Take 1 tablet (5 mg total) by mouth at bedtime as needed. Insomnia   30 tablet  0    BP 120/88  Pulse 78  Temp(Src) 98 F (36.7 C) (Oral)  Resp 16  Ht 5\' 2"  (1.575 m)  Wt 185 lb 1.3 oz (83.952 kg)  BMI 33.85 kg/m2       Objective:   Physical Exam  Constitutional: She appears well-developed and well-nourished.  HENT:  Head: Normocephalic and atraumatic.  Right Ear: Tympanic membrane and ear canal normal.  Mouth/Throat: Uvula is midline, oropharynx is clear and moist and mucous membranes are normal.       + left frontal sinus tenderness to palpation + left maxillary sinus tenderness to palpation. L TM with cloudy effusion, no erythema or bulging. Some inflammation of gum around tooth on left  bottom.   Cardiovascular: Normal rate and regular rhythm.   No murmur heard. Pulmonary/Chest: Effort normal and breath sounds normal. No respiratory distress. She has no wheezes. She has no rales. She exhibits no tenderness.          Assessment & Plan:  Notified Weston Brass PharmD in [place] coumadin clinic re: initiation of antibiotics.

## 2010-12-19 ENCOUNTER — Encounter: Payer: Managed Care, Other (non HMO) | Admitting: *Deleted

## 2010-12-22 ENCOUNTER — Other Ambulatory Visit: Payer: Self-pay | Admitting: Thoracic Surgery (Cardiothoracic Vascular Surgery)

## 2010-12-22 DIAGNOSIS — J90 Pleural effusion, not elsewhere classified: Secondary | ICD-10-CM

## 2010-12-23 ENCOUNTER — Telehealth: Payer: Self-pay | Admitting: *Deleted

## 2010-12-23 NOTE — Telephone Encounter (Signed)
Received message from pt that she continues to have severe sinus pressure that will not go away. States she has been taking Tylenol more than recommended on bottle without relief, flonase and saline nasal rinse. States she is not able to take decongestants now. Per Sandford Craze, NP pt needs to be seen tomorrow. Advised pt and scheduled appt for 9:45am.

## 2010-12-24 ENCOUNTER — Telehealth: Payer: Self-pay | Admitting: Family

## 2010-12-24 ENCOUNTER — Ambulatory Visit: Payer: Managed Care, Other (non HMO) | Admitting: Family

## 2010-12-24 NOTE — Telephone Encounter (Signed)
Patient states she used lots of saline rinse yesterday and some ibuprofen and she feels much better today.  Cancelled her appt this am

## 2010-12-25 ENCOUNTER — Telehealth: Payer: Self-pay

## 2010-12-25 NOTE — Telephone Encounter (Signed)
Requesting Rx refill on Vicodin 5/500 mg po every 4-6 hrs prn/pain #40/0. Rx R/F called into [facility]

## 2010-12-25 NOTE — Telephone Encounter (Signed)
Pt called c/o Rt breast pain and swelling x 4 days. She denies any fevers or drainage or redness at incision site. She said she had this same problem back in May and was put on IBU daily and saw ID doctor but nothing came from it. Her sx's resolved on there on. She is currently on  Amoxicillin for ear infection from PCP, and she is taking tylenol and Vicodin for the breast pain. She is also on Coumadin. Please Advise.

## 2010-12-26 ENCOUNTER — Ambulatory Visit (INDEPENDENT_AMBULATORY_CARE_PROVIDER_SITE_OTHER): Payer: Managed Care, Other (non HMO) | Admitting: *Deleted

## 2010-12-26 DIAGNOSIS — I052 Rheumatic mitral stenosis with insufficiency: Secondary | ICD-10-CM

## 2010-12-26 DIAGNOSIS — Z7901 Long term (current) use of anticoagulants: Secondary | ICD-10-CM

## 2010-12-26 DIAGNOSIS — Z954 Presence of other heart-valve replacement: Secondary | ICD-10-CM

## 2010-12-26 LAB — POCT INR: INR: 2.4

## 2010-12-31 ENCOUNTER — Ambulatory Visit (INDEPENDENT_AMBULATORY_CARE_PROVIDER_SITE_OTHER): Payer: Managed Care, Other (non HMO) | Admitting: Cardiology

## 2010-12-31 ENCOUNTER — Encounter: Payer: Self-pay | Admitting: Cardiology

## 2010-12-31 ENCOUNTER — Encounter: Payer: Self-pay | Admitting: Family

## 2010-12-31 ENCOUNTER — Ambulatory Visit (INDEPENDENT_AMBULATORY_CARE_PROVIDER_SITE_OTHER): Payer: Managed Care, Other (non HMO) | Admitting: Family

## 2010-12-31 ENCOUNTER — Ambulatory Visit: Payer: Managed Care, Other (non HMO) | Admitting: Family

## 2010-12-31 ENCOUNTER — Telehealth: Payer: Self-pay | Admitting: Family

## 2010-12-31 VITALS — BP 136/90 | HR 84 | Temp 97.9°F | Resp 16 | Ht 62.0 in | Wt 181.0 lb

## 2010-12-31 DIAGNOSIS — Z954 Presence of other heart-valve replacement: Secondary | ICD-10-CM

## 2010-12-31 DIAGNOSIS — N951 Menopausal and female climacteric states: Secondary | ICD-10-CM

## 2010-12-31 DIAGNOSIS — Z952 Presence of prosthetic heart valve: Secondary | ICD-10-CM

## 2010-12-31 DIAGNOSIS — Z23 Encounter for immunization: Secondary | ICD-10-CM

## 2010-12-31 DIAGNOSIS — F329 Major depressive disorder, single episode, unspecified: Secondary | ICD-10-CM

## 2010-12-31 DIAGNOSIS — E785 Hyperlipidemia, unspecified: Secondary | ICD-10-CM

## 2010-12-31 DIAGNOSIS — N644 Mastodynia: Secondary | ICD-10-CM | POA: Insufficient documentation

## 2010-12-31 DIAGNOSIS — I10 Essential (primary) hypertension: Secondary | ICD-10-CM

## 2010-12-31 DIAGNOSIS — I359 Nonrheumatic aortic valve disorder, unspecified: Secondary | ICD-10-CM

## 2010-12-31 DIAGNOSIS — I509 Heart failure, unspecified: Secondary | ICD-10-CM

## 2010-12-31 MED ORDER — VENLAFAXINE HCL 37.5 MG PO TABS: 37.5000 mg | ORAL_TABLET | Freq: Two times a day (BID) | ORAL | Status: DC

## 2010-12-31 MED ORDER — FLUOXETINE HCL 40 MG PO CAPS: 40.0000 mg | ORAL_CAPSULE | Freq: Every day | ORAL | Status: DC

## 2010-12-31 MED ORDER — AMLODIPINE BESYLATE 5 MG PO TABS: 5.0000 mg | ORAL_TABLET | Freq: Every day | ORAL | Status: DC

## 2010-12-31 MED ORDER — FUROSEMIDE 40 MG PO TABS: 40.0000 mg | ORAL_TABLET | Freq: Every day | ORAL | Status: DC

## 2010-12-31 NOTE — Assessment & Plan Note (Addendum)
Continue Coumadin. Continue SBE prophylaxis. Repeat echocardiogram when she returns in 6 months. She feels as though she has developed more fluid in the previous collection following mitral valve surgery. Proceed with CT scan and followup Dr. Cornelius Moras.

## 2010-12-31 NOTE — Assessment & Plan Note (Signed)
Management per primary care. 

## 2010-12-31 NOTE — Patient Instructions (Signed)
You will be contacted about your referral to GYN. Follow up in 3 months.  Sooner if problems or concerns.

## 2010-12-31 NOTE — Assessment & Plan Note (Addendum)
This is being evaluated by Dr. Barry Dienes.  No clinical sign of infection.  She is overdue for mammogram. Will order diagnostic mammogram. Pt aware.

## 2010-12-31 NOTE — Assessment & Plan Note (Signed)
Patient with volume excess from previously now improved. Decrease Lasix to 40 mg daily and check potassium and renal function in one week. Take additional 40 mg as needed.

## 2010-12-31 NOTE — Assessment & Plan Note (Addendum)
This is improving.  Will continue to monitor on Effexor and Prozac.

## 2010-12-31 NOTE — Progress Notes (Signed)
WJX:[id number] female for f/u of MS/MR. On July 17, 2010 the patient had right miniature thoracotomy for mitral valve replacement (29-mm [employer] mechanical prosthesis). Note preoperative cardiac catheterization showed no coronary disease. Patient readmitted May 2012 with fever. Blood cultures were negative. There was a small fluid collection in the anterior chest that was aspirated which was negative. Echocardiogram in May of 2012 showed normal LV function, biatrial enlargement, prosthetic mitral valve, moderate tricuspid regurgitation and mild aortic insufficiency. 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 The patient did have recurrent fevers but workup negative. This included an evaluation by Dr. Maurice March. Followup chest CT in May of 2012 showed no change in her fluid collection. I last saw her in July of 2012. Since that time she denies dyspnea on exertion, orthopnea, PND, pedal edema, syncope, fevers or chills. In the past week she has developed soreness in the right chest where her previous fluid collection was. She has been scheduled for a followup CT scan and appointment with Dr. Cornelius Moras to evaluate this.   Current Outpatient Prescriptions  Medication Sig Dispense Refill  . acetaminophen (TYLENOL) 500 MG tablet Take 500 mg by mouth every 6 (six) hours as needed.        Marland Kitchen aspirin 81 MG EC tablet Take 81 mg by mouth daily.        . diphenhydrAMINE (BENADRYL) 25 MG tablet Take 25 mg by mouth at bedtime as needed.        Marland Kitchen FLUoxetine (PROZAC) 40 MG capsule TAKE ONE CAPSULE BY MOUTH EVERY DAY  30 capsule  1  . fluticasone (FLONASE) 50 MCG/ACT nasal spray 2 sprays by Nasal route daily.  16 g  0  . furosemide (LASIX) 40 MG tablet Take 1 1/2 tablet by mouth daily.      Marland Kitchen HYDROcodone-acetaminophen (VICODIN) 5-500 MG per tablet       . loratadine (CLARITIN) 10 MG tablet Take 10 mg by mouth daily.        . metoprolol (LOPRESSOR) 100 MG tablet Take 1 tablet (100 mg total) by mouth 2 (two) times daily.  60 tablet  11  .  omeprazole (PRILOSEC) 20 MG capsule Take 1 capsule (20 mg total) by mouth daily.  30 capsule  5  . potassium chloride SA (KLOR-CON M20) 20 MEQ tablet Take 1 tablet by mouth twice a day.  60 tablet  6  . venlafaxine (EFFEXOR) 37.5 MG tablet Take 1 tablet (37.5 mg total) by mouth 2 (two) times daily.  60 tablet  2  . warfarin (COUMADIN) 5 MG tablet Take as directed by Anticoagulation clinic   40 tablet  3  . zolpidem (AMBIEN) 5 MG tablet Take 1 tablet (5 mg total) by mouth at bedtime as needed. Insomnia   30 tablet  0     Past Medical History  Diagnosis Date  . Hypertension   . Hyperlipidemia   . GERD (gastroesophageal reflux disease)   . Depression   . Asthma   . Eczema   . History of rheumatic fever   . Right shoulder injury     history of  . Mitral stenosis with regurgitation   . Syphilis prior 2006    Pt was treated for + RPR/titer    Past Surgical History  Procedure Date  . Cesarean section 1991  . Abdominal hysterectomy 01/2004    partial  . Breast surgery 05/2007    left breast biposy  . Femur fracture surgery 1983    right  . Right miniature thoracotomy for  mitral valve replacement 07/16/2010    Dr. Tressie Stalker    History   Social History  . Marital Status: Single    Spouse Name: N/A    Number of Children: 3  . Years of Education: N/A   Occupational History  . SALES    Social History Main Topics  . Smoking status: Former Games developer  . Smokeless tobacco: Not on file   Comment: briefly as teenager  . Alcohol Use: No  . Drug Use: No  . Sexually Active: Not on file   Other Topics Concern  . Not on file   Social History Narrative   Regular exercise: yes    ROS: no fevers or chills, productive cough, hemoptysis, dysphasia, odynophagia, melena, hematochezia, dysuria, hematuria, rash, seizure activity, orthopnea, PND, pedal edema, claudication. Remaining systems are negative.  Physical Exam: Well-developed well-nourished in no acute distress.  Skin is  warm and dry.  HEENT is normal.  Neck is supple. No thyromegaly.  Chest is clear to auscultation with normal expansion. Tender over right chest area but no fluid collection palpated. Cardiovascular exam is regular rate and rhythm.  Abdominal exam nontender or distended. No masses palpated. Extremities show no edema. neuro grossly intact  ECG normal sinus rhythm with first degree AV block. No ST changes. Biatrial enlargement.

## 2010-12-31 NOTE — Progress Notes (Signed)
Addended by: Mervin Kung A on: 12/31/2010 02:03 PM   Modules accepted: Orders

## 2010-12-31 NOTE — Progress Notes (Signed)
Subjective:    Patient ID: Julia Garza, female    DOB: Dec 04, 1960, 50 y.o.   MRN: [id number]  HPI  Julia Garza is a 50 yr old female who presents today for follow up.    Hot flashes-  She notes that her symptoms have improved only slightly since the addition of there effexor.   Depression- improving.  She thinks that the effexor is helping her.    Effusion of right breast- She reports that following her MVR she developed a fluid collection in the right breast which had drained while she is in the hospital.  She has recently developed swelling and tenderness again in the same area of the right breast and has been following with Dr. Cornelius Moras who has placed her back on vicodin.  She is scheduled for a CT scan on the 8th.   Review of Systems    see HPI  Past Medical History  Diagnosis Date  . Hypertension   . Hyperlipidemia   . GERD (gastroesophageal reflux disease)   . Depression   . Asthma   . Eczema   . History of rheumatic fever   . Right shoulder injury     history of  . Mitral stenosis with regurgitation   . Syphilis prior 2006    Pt was treated for + RPR/titer    History   Social History  . Marital Status: Single    Spouse Name: N/A    Number of Children: 3  . Years of Education: N/A   Occupational History  . SALES    Social History Main Topics  . Smoking status: Former Games developer  . Smokeless tobacco: Not on file   Comment: briefly as teenager  . Alcohol Use: No  . Drug Use: No  . Sexually Active: Not on file   Other Topics Concern  . Not on file   Social History Narrative   Regular exercise: yes    Past Surgical History  Procedure Date  . Cesarean section 1991  . Abdominal hysterectomy 01/2004    partial  . Breast surgery 05/2007    left breast biposy  . Femur fracture surgery 1983    right  . 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 Tressie Stalker    Family History  Problem Relation Age of Onset  . Heart disease  Mother   . Kidney disease Mother   . Diabetes Mother   . Thyroid disease Mother   . Colon polyps Mother   . Heart attack Mother   . Hypertension Mother   . Heart disease Father   . Heart attack Father     No Known Allergies  Current Outpatient Prescriptions on File Prior to Visit  Medication Sig Dispense Refill  . acetaminophen (TYLENOL) 500 MG tablet Take 500 mg by mouth every 6 (six) hours as needed.        Marland Kitchen aspirin 81 MG EC tablet Take 81 mg by mouth daily.        . diphenhydrAMINE (BENADRYL) 25 MG tablet Take 25 mg by mouth at bedtime as needed.        Marland Kitchen FLUoxetine (PROZAC) 40 MG capsule TAKE ONE CAPSULE BY MOUTH EVERY DAY  30 capsule  1  . fluticasone (FLONASE) 50 MCG/ACT nasal spray 2 sprays by Nasal route daily.  16 g  0  . HYDROcodone-acetaminophen (VICODIN) 5-500 MG per tablet       . loratadine (CLARITIN) 10 MG tablet Take 10 mg by  mouth daily.        . metoprolol (LOPRESSOR) 100 MG tablet Take 1 tablet (100 mg total) by mouth 2 (two) times daily.  60 tablet  11  . omeprazole (PRILOSEC) 20 MG capsule Take 1 capsule (20 mg total) by mouth daily.  30 capsule  5  . potassium chloride SA (KLOR-CON M20) 20 MEQ tablet Take 1 tablet by mouth twice a day.  60 tablet  6  . warfarin (COUMADIN) 5 MG tablet Take as directed by Anticoagulation clinic   40 tablet  3  . zolpidem (AMBIEN) 5 MG tablet Take 1 tablet (5 mg total) by mouth at bedtime as needed. Insomnia   30 tablet  0  . DISCONTD: furosemide (LASIX) 40 MG tablet Take 1 1/2 tablet by mouth daily.      Marland Kitchen amLODipine (NORVASC) 5 MG tablet Take 1 tablet (5 mg total) by mouth daily.  30 tablet  11  . furosemide (LASIX) 40 MG tablet Take 1 tablet (40 mg total) by mouth daily.  30 tablet  12    BP 136/90  Pulse 84  Temp(Src) 97.9 F (36.6 C) (Oral)  Resp 16  Ht 5\' 2"  (1.575 m)  Wt 181 lb 0.6 oz (82.119 kg)  BMI 33.11 kg/m2    Objective:   Physical Exam  Constitutional: She appears well-developed and well-nourished. No  distress.  Cardiovascular: Normal rate and regular rhythm.   No murmur heard. Pulmonary/Chest: Effort normal and breath sounds normal. No respiratory distress. She has no wheezes. She has no rales.  Genitourinary:       R breast is noted to be slightly fuller around 2 oclock, mild tenderness to touch without erythema, warmth or discrete mass/abscess noted.    Psychiatric: She has a normal mood and affect. Her behavior is normal. Judgment and thought content normal.          Assessment & Plan:

## 2010-12-31 NOTE — Patient Instructions (Signed)
Your physician wants you to follow-up in: 6 MONTHS You will receive a reminder letter in the mail two months in advance. If you don't receive a letter, please call our office to schedule the follow-up appointment.   DECREASE FUROSEMIDE TO 40 MG ONCE DAILY  DECREASE POTASSIUM TO ONCE DAILY  START AMLODIPINE 5 MG ONCE DAILY  Your physician recommends that you return for lab work in: ONE WEEK

## 2010-12-31 NOTE — Assessment & Plan Note (Addendum)
Deteriorated.  Will refer to GYN for evaluation re: resuming HRT.

## 2010-12-31 NOTE — Assessment & Plan Note (Signed)
Blood pressure elevated. Add Norvasc 5 mg daily. 

## 2010-12-31 NOTE — Telephone Encounter (Signed)
Called pt to check on last mammogram. She tells me that this was not completed last march when ordered due to sickness.  She is willing to complete.

## 2011-01-03 ENCOUNTER — Other Ambulatory Visit: Payer: Self-pay | Admitting: Family

## 2011-01-05 ENCOUNTER — Ambulatory Visit: Payer: Managed Care, Other (non HMO) | Admitting: Thoracic Surgery (Cardiothoracic Vascular Surgery)

## 2011-01-05 ENCOUNTER — Ambulatory Visit
Admission: RE | Admit: 2011-01-05 | Discharge: 2011-01-05 | Disposition: A | Discharge status: Home / Self Care | Payer: Managed Care, Other (non HMO) | Source: Ambulatory Visit | Attending: Thoracic Surgery (Cardiothoracic Vascular Surgery) | Admitting: Thoracic Surgery (Cardiothoracic Vascular Surgery)

## 2011-01-05 ENCOUNTER — Other Ambulatory Visit: Payer: Self-pay | Admitting: Family

## 2011-01-05 DIAGNOSIS — J90 Pleural effusion, not elsewhere classified: Secondary | ICD-10-CM

## 2011-01-05 NOTE — Telephone Encounter (Signed)
Request for zolpidem was left on voicemail at [facility] this morning. Current request denied.

## 2011-01-07 ENCOUNTER — Ambulatory Visit (INDEPENDENT_AMBULATORY_CARE_PROVIDER_SITE_OTHER): Payer: Managed Care, Other (non HMO) | Admitting: Thoracic Surgery (Cardiothoracic Vascular Surgery)

## 2011-01-07 ENCOUNTER — Encounter: Payer: Self-pay | Admitting: Thoracic Surgery (Cardiothoracic Vascular Surgery)

## 2011-01-07 VITALS — BP 128/88 | HR 79 | Resp 16 | Ht 62.0 in | Wt 181.0 lb

## 2011-01-07 DIAGNOSIS — J90 Pleural effusion, not elsewhere classified: Secondary | ICD-10-CM

## 2011-01-07 DIAGNOSIS — G8918 Other acute postprocedural pain: Secondary | ICD-10-CM | POA: Insufficient documentation

## 2011-01-07 DIAGNOSIS — G8912 Acute post-thoracotomy pain: Secondary | ICD-10-CM

## 2011-01-07 DIAGNOSIS — I359 Nonrheumatic aortic valve disorder, unspecified: Secondary | ICD-10-CM

## 2011-01-07 MED ORDER — GABAPENTIN 300 MG PO CAPS: 300.0000 mg | ORAL_CAPSULE | Freq: Three times a day (TID) | ORAL | Status: DC

## 2011-01-07 NOTE — Progress Notes (Deleted)
PCP is Lemont Fillers., NP, NP Referring Provider is Lemont Fillers.,*  Chief Complaint  Patient presents with  . Follow-up    mini mvr and pleural effusion recurrence    HPI:   Past Medical History  Diagnosis Date  . Hypertension   . Hyperlipidemia   . GERD (gastroesophageal reflux disease)   . Depression   . Asthma   . Eczema   . History of rheumatic fever   . Right shoulder injury     history of  . Mitral stenosis with regurgitation   . Syphilis prior 2006    Pt was treated for + RPR/titer    Past Surgical History  Procedure Date  . Cesarean section 1991  . Abdominal hysterectomy 01/2004    partial  . Breast surgery 05/2007    left breast biposy  . Femur fracture surgery 1983    right  . Mitral valve replacement 07/16/2010    #44mm Carbomedics mechanical prosthesis via right mini thoracotomy    Family History  Problem Relation Age of Onset  . Heart disease Mother   . Kidney disease Mother   . Diabetes Mother   . Thyroid disease Mother   . Colon polyps Mother   . Heart attack Mother   . Hypertension Mother   . Heart disease Father   . Heart attack Father     Social History History  Substance Use Topics  . Smoking status: Former Games developer  . Smokeless tobacco: Not on file   Comment: briefly as teenager  . Alcohol Use: No    Current Outpatient Prescriptions  Medication Sig Dispense Refill  . acetaminophen (TYLENOL) 500 MG tablet Take 500 mg by mouth every 6 (six) hours as needed.        Marland Kitchen amLODipine (NORVASC) 5 MG tablet Take 1 tablet (5 mg total) by mouth daily.  30 tablet  11  . aspirin 81 MG EC tablet Take 81 mg by mouth daily.        . diphenhydrAMINE (BENADRYL) 25 MG tablet Take 25 mg by mouth at bedtime as needed.        Marland Kitchen FLUoxetine (PROZAC) 40 MG capsule Take 1 capsule (40 mg total) by mouth daily.  30 capsule  5  . fluticasone (FLONASE) 50 MCG/ACT nasal spray 2 sprays by Nasal route daily.  16 g  0  . furosemide (LASIX) 40 MG  tablet Take 1 tablet (40 mg total) by mouth daily.  30 tablet  12  . HYDROcodone-acetaminophen (VICODIN) 5-500 MG per tablet       . loratadine (CLARITIN) 10 MG tablet Take 10 mg by mouth daily.        . metoprolol (LOPRESSOR) 100 MG tablet Take 1 tablet (100 mg total) by mouth 2 (two) times daily.  60 tablet  11  . omeprazole (PRILOSEC) 20 MG capsule Take 1 capsule (20 mg total) by mouth daily.  30 capsule  5  . potassium chloride (K-DUR) 10 MEQ tablet Take 20 mEq by mouth 1 day or 1 dose.        . venlafaxine (EFFEXOR) 37.5 MG tablet Take 1 tablet (37.5 mg total) by mouth 2 (two) times daily.  60 tablet  2  . warfarin (COUMADIN) 5 MG tablet Take as directed by Anticoagulation clinic   40 tablet  3  . zolpidem (AMBIEN) 5 MG tablet TAKE ONE TABLET BY MOUTH AT BEDTIME AS NEEDED FOR INSOMNIA.  30 tablet  0  . gabapentin (NEURONTIN) 300 MG capsule  Take 1 capsule (300 mg total) by mouth 3 (three) times daily.  90 capsule  2  . potassium chloride SA (K-DUR,KLOR-CON) 20 MEQ tablet 20 mEq daily. Take 1 tablet by mouth twice a day.         No Known Allergies  Review of Systems  BP 128/88  Pulse 79  Resp 16  Ht 5\' 2"  (1.575 m)  Wt 181 lb (82.101 kg)  BMI 33.11 kg/m2  SpO2 99% Physical Exam   Diagnostic Tests:   Impression:   Plan:

## 2011-01-07 NOTE — Progress Notes (Signed)
PCP is Lemont Fillers., NP, NP Referring Provider is Lemont Fillers.,*  Chief Complaint  Patient presents with  . Follow-up    mini mvr and pleural effusion recurrence    HPI:  Patient returns for further followup status post right miniature thoracotomy for mitral valve replacement on 07/16/2010. She was last seen here in the office on 10/06/2010. Since then she has been doing well, although she still has chest pain localized to the right anterior chest wall. She has not had any fevers or chills. She's not had any night sweats. She's not had any shortness of breath. The remainder of her review of systems unremarkable and in fact she is getting along quite well physically other than for her chronic pain. The pain is well localized to the right anterior chest wall immediately medial to her previous surgical incision. She thinks that her right breast is a little swollen as well. Palpation in this area reproduces her symptoms. The remainder of her review of systems unremarkable.   Past Medical History  Diagnosis Date  . Hypertension   . Hyperlipidemia   . GERD (gastroesophageal reflux disease)   . Depression   . Asthma   . Eczema   . History of rheumatic fever   . Right shoulder injury     history of  . Mitral stenosis with regurgitation   . Syphilis prior 2006    Pt was treated for + RPR/titer    Past Surgical History  Procedure Date  . Cesarean section 1991  . Abdominal hysterectomy 01/2004    partial  . Breast surgery 05/2007    left breast biposy  . Femur fracture surgery 1983    right  . Mitral valve replacement 07/16/2010    #45mm Carbomedics mechanical prosthesis via right mini thoracotomy    Family History  Problem Relation Age of Onset  . Heart disease Mother   . Kidney disease Mother   . Diabetes Mother   . Thyroid disease Mother   . Colon polyps Mother   . Heart attack Mother   . Hypertension Mother   . Heart disease Father   . Heart attack  Father     Social History History  Substance Use Topics  . Smoking status: Former Games developer  . Smokeless tobacco: Not on file   Comment: briefly as teenager  . Alcohol Use: No    Current Outpatient Prescriptions  Medication Sig Dispense Refill  . acetaminophen (TYLENOL) 500 MG tablet Take 500 mg by mouth every 6 (six) hours as needed.        Marland Kitchen amLODipine (NORVASC) 5 MG tablet Take 1 tablet (5 mg total) by mouth daily.  30 tablet  11  . aspirin 81 MG EC tablet Take 81 mg by mouth daily.        . diphenhydrAMINE (BENADRYL) 25 MG tablet Take 25 mg by mouth at bedtime as needed.        Marland Kitchen FLUoxetine (PROZAC) 40 MG capsule Take 1 capsule (40 mg total) by mouth daily.  30 capsule  5  . fluticasone (FLONASE) 50 MCG/ACT nasal spray 2 sprays by Nasal route daily.  16 g  0  . furosemide (LASIX) 40 MG tablet Take 1 tablet (40 mg total) by mouth daily.  30 tablet  12  . HYDROcodone-acetaminophen (VICODIN) 5-500 MG per tablet       . loratadine (CLARITIN) 10 MG tablet Take 10 mg by mouth daily.        . metoprolol (LOPRESSOR) 100  MG tablet Take 1 tablet (100 mg total) by mouth 2 (two) times daily.  60 tablet  11  . omeprazole (PRILOSEC) 20 MG capsule Take 1 capsule (20 mg total) by mouth daily.  30 capsule  5  . potassium chloride (K-DUR) 10 MEQ tablet Take 20 mEq by mouth 1 day or 1 dose.        . venlafaxine (EFFEXOR) 37.5 MG tablet Take 1 tablet (37.5 mg total) by mouth 2 (two) times daily.  60 tablet  2  . warfarin (COUMADIN) 5 MG tablet Take as directed by Anticoagulation clinic   40 tablet  3  . zolpidem (AMBIEN) 5 MG tablet TAKE ONE TABLET BY MOUTH AT BEDTIME AS NEEDED FOR INSOMNIA.  30 tablet  0  . gabapentin (NEURONTIN) 300 MG capsule Take 1 capsule (300 mg total) by mouth 3 (three) times daily.  90 capsule  2  . potassium chloride SA (K-DUR,KLOR-CON) 20 MEQ tablet 20 mEq daily. Take 1 tablet by mouth twice a day.         No Known Allergies  Review of Systems  Constitutional: Negative.   Negative for fever, chills, diaphoresis, activity change and unexpected weight change.  HENT: Negative.   Eyes: Negative.   Respiratory: Negative for cough, shortness of breath, wheezing and stridor.        Chest-wall pain well localized to right anterior chest.  Cardiovascular: Negative.  Negative for chest pain, palpitations and leg swelling.  Gastrointestinal: Negative.   Genitourinary: Negative.   Musculoskeletal: Negative.   Neurological: Negative.   Hematological: Negative.   Psychiatric/Behavioral: Negative.     BP 128/88  Pulse 79  Resp 16  Ht 5\' 2"  (1.575 m)  Wt 181 lb (82.101 kg)  BMI 33.11 kg/m2  SpO2 99% Physical Exam  Vitals reviewed. Constitutional: She is oriented to person, place, and time. She appears well-developed and well-nourished.  HENT:  Head: Normocephalic.  Eyes: Pupils are equal, round, and reactive to light.  Neck: Normal range of motion. Neck supple. No JVD present.  Cardiovascular: Normal rate and regular rhythm.   No murmur heard. Pulmonary/Chest: Effort normal and breath sounds normal. She has no wheezes. She has no rales. She exhibits tenderness.       Chest wall pain reproduced with palpation anterior medial right chest.  No incisional hernia present.  No fluctuance on palpation.  No swelling.  Breasts are symmetrical.  Abdominal: Soft. Bowel sounds are normal. There is no tenderness.  Musculoskeletal: Normal range of motion. She exhibits no edema.  Lymphadenopathy:    She has no cervical adenopathy.  Neurological: She is alert and oriented to person, place, and time.  Skin: Skin is warm and dry.  Psychiatric: She has a normal mood and affect. Her behavior is normal. Judgment and thought content normal.     Diagnostic Tests:  Chest CT scan performed 01/05/2011 is reviewed. The small loculated fluid collection seen previously along the pericardial border in the right anterior chest has decreased in size substantially. Everything else is  completely normal. The right breast is normal and seen in size in comparison with the left. There is no sign of any soft tissue fluid collection or other abnormality in the right breast her right anterior chest wall. There is no sign of any chest wall hernia. The lung fields are essentially completely clear. There are no pleural effusions. No other abnormalities are noted.    Impression:  The patient seems to be doing very well now almost 6  months following mitral valve replacement via right mini thoracotomy approach. She does still have pain in the right anterior chest wall that is most consistent with post thoracotomy pain.   Plan:  I've given the patient a prescription for Neurontin to see if this will assist with her pain. Otherwise I reassured her that there does not appear to be anything significant wrong there is certainly no sign of any ongoing or smoldering infection. We will plan to see her back in 3 months for further routine followup to see how she is getting along.

## 2011-01-12 ENCOUNTER — Other Ambulatory Visit: Payer: Self-pay | Admitting: Family

## 2011-01-12 ENCOUNTER — Encounter: Payer: Managed Care, Other (non HMO) | Admitting: *Deleted

## 2011-01-12 NOTE — Telephone Encounter (Signed)
Spoke to pt. She states she has completed Vicodin. Has been getting injections for her shoulder pain that have been helping but reports a lot of pain with her shoulder today. Pt is requesting refill of Tramadol. Please advise if ok and if so, how many refills?

## 2011-01-12 NOTE — Telephone Encounter (Signed)
Notified pt. Denial sent to pharmacy.

## 2011-01-12 NOTE — Telephone Encounter (Signed)
I would recommend that she try tylenol for pain.  If no improvement, contact Dr. Pearletha Forge for further recommendations/refills.

## 2011-01-15 ENCOUNTER — Encounter: Payer: Self-pay | Admitting: Family Medicine

## 2011-01-15 ENCOUNTER — Ambulatory Visit (INDEPENDENT_AMBULATORY_CARE_PROVIDER_SITE_OTHER): Payer: Managed Care, Other (non HMO) | Admitting: Family Medicine

## 2011-01-15 ENCOUNTER — Telehealth: Payer: Self-pay | Admitting: *Deleted

## 2011-01-15 VITALS — BP 128/90 | HR 83 | Temp 97.7°F | Ht 60.0 in | Wt 181.0 lb

## 2011-01-15 DIAGNOSIS — M5412 Radiculopathy, cervical region: Secondary | ICD-10-CM

## 2011-01-15 DIAGNOSIS — M542 Cervicalgia: Secondary | ICD-10-CM

## 2011-01-15 MED ORDER — CYCLOBENZAPRINE HCL 10 MG PO TABS: 10.0000 mg | ORAL_TABLET | Freq: Three times a day (TID) | ORAL | Status: DC | PRN

## 2011-01-15 NOTE — Patient Instructions (Signed)
Your symptoms are most indicative of cervical radiculopathy (a pinched nerve in the neck) with trapezius spasms. Flexeril three times a day as needed for muscle spasms (can make you sleepy - if so do not drive while taking this). Tramadol + extra strength tylenol for severe pain (no driving on this medicine). Consider cervical collar if severely painful. Simple range of motion exercises within limits of pain to prevent further stiffness. Consider physical therapy for stretching, exercises, and modalities - call me when you want to go ahead with this. Heat 15 minutes at a time 3-4 times a day to help with spasms. Watch head position when on computers, texting, when sleeping in bed - should in line with back to prevent further nerve traction and irritation. Consider home traction unit if you get benefit with this in physical therapy. If not improving we will consider an MRI, injections, other medications, or surgery depending on how you are doing and what further imaging shows.
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
## 2011-01-15 NOTE — Assessment & Plan Note (Signed)
Cervical radiculopathy/Trapezius spasms - Options are somewhat limited due to finances and patient being on coumadin.  Will not burst prednisone or try nsaids because of the latter.  Offered PT + ionto but patient declined at this time - she would like to give it more time, muscle relaxants, try some massage and heat first.  Cervical collar provided and discussed ergonomic issues.  I do think her shoulder issue has now resolved - exam is significantly improved.  Advised to call us to start PT if she changes her mind - would try this prior to doing further imaging first.  Call if any bowel/bladder dysfunction.

## 2011-01-15 NOTE — Telephone Encounter (Signed)
Pt left voice mail that she is unable to tolerate her hot flashes. Pt states she cannot afford her co-pays for a GYN, sports medicine and coumadin every 2 weeks (specialist copays).  Pt is requesting rx from [place] for Estradiol as she is having financial difficulties. Please advise.

## 2011-01-16 MED ORDER — ESTRADIOL 1 MG PO TABS: ORAL_TABLET | ORAL | Status: DC

## 2011-01-16 NOTE — Telephone Encounter (Signed)
I have sent estradiol to her pharmacy to be taken once daily. She should take 21 day on, then 7 days off of medication.

## 2011-01-16 NOTE — Telephone Encounter (Signed)
Pt.notified

## 2011-01-16 NOTE — Telephone Encounter (Signed)
See 10/19 note

## 2011-01-16 NOTE — Telephone Encounter (Signed)
Pt called requesting status of request for Estradiol. Please advise.

## 2011-01-28 ENCOUNTER — Encounter: Payer: Self-pay | Admitting: Family

## 2011-01-28 ENCOUNTER — Telehealth: Payer: Self-pay | Admitting: *Deleted

## 2011-01-28 ENCOUNTER — Ambulatory Visit (INDEPENDENT_AMBULATORY_CARE_PROVIDER_SITE_OTHER): Payer: Managed Care, Other (non HMO) | Admitting: *Deleted

## 2011-01-28 DIAGNOSIS — I052 Rheumatic mitral stenosis with insufficiency: Secondary | ICD-10-CM

## 2011-01-28 DIAGNOSIS — Z7901 Long term (current) use of anticoagulants: Secondary | ICD-10-CM

## 2011-01-28 DIAGNOSIS — Z954 Presence of other heart-valve replacement: Secondary | ICD-10-CM

## 2011-01-28 MED ORDER — WARFARIN SODIUM 5 MG PO TABS: ORAL_TABLET | ORAL | Status: DC

## 2011-01-28 NOTE — Telephone Encounter (Signed)
Call returned to patient at [phone], she was advised per Sandford Craze instructions and has verbalized understanding. She stated that she has taken Prozac for years and does not believe that is the cause of her wt gain. She also stated that she was seen by her cardiologist and she stated she has not been retaining fluid. She has scheduled follow up of Friday 01/30/2011 @ 9:30a. Patient wanted it noted that she has stop taking both the Effexor and the Prozac. Medication list updated to reflect.

## 2011-01-28 NOTE — Telephone Encounter (Signed)
Received message from pt stating she has gained 12 lbs since she saw [place] last. Now weight 193. Pt is concerned that her weight gain is coming from the Effexor. States she works out every day and diets and is still gaining weight. Feels depression was improved on Effexor but the weight gain is causing her to be more depressed. Please advise.

## 2011-01-28 NOTE — Telephone Encounter (Signed)
I doubt effexor is cause of weight gain, but prozac may be contributing.  I would like to see her back in the office to check her heart and lungs to make sure that she is not holding any extra fluid and to discuss medication options.

## 2011-01-30 ENCOUNTER — Other Ambulatory Visit: Payer: Self-pay | Admitting: Family

## 2011-01-30 ENCOUNTER — Encounter: Payer: Self-pay | Admitting: Family

## 2011-01-30 ENCOUNTER — Ambulatory Visit (INDEPENDENT_AMBULATORY_CARE_PROVIDER_SITE_OTHER): Payer: Managed Care, Other (non HMO) | Admitting: Family

## 2011-01-30 VITALS — BP 114/82 | HR 72 | Temp 97.7°F | Resp 16 | Wt 190.0 lb

## 2011-01-30 DIAGNOSIS — K5900 Constipation, unspecified: Secondary | ICD-10-CM

## 2011-01-30 DIAGNOSIS — R635 Abnormal weight gain: Secondary | ICD-10-CM | POA: Insufficient documentation

## 2011-01-30 DIAGNOSIS — F329 Major depressive disorder, single episode, unspecified: Secondary | ICD-10-CM

## 2011-01-30 DIAGNOSIS — F3289 Other specified depressive episodes: Secondary | ICD-10-CM

## 2011-01-30 MED ORDER — DULOXETINE HCL 60 MG PO CPEP: 60.0000 mg | ORAL_CAPSULE | Freq: Every day | ORAL | Status: DC

## 2011-01-30 NOTE — Patient Instructions (Signed)
Stop effexor, stop fluoxetine. Start Cymbalta.  Follow up in 4-6 weeks.  Try to keep you calories to 1200 a day.   Complete lab work prior to leaving today.

## 2011-01-30 NOTE — Assessment & Plan Note (Signed)
I recommended trial of a fiber supplement once daily such as metamucil wafers.

## 2011-01-30 NOTE — Progress Notes (Signed)
Subjective:    Patient ID: Julia Garza, female    DOB: Jul 29, 1960, 50 y.o.   MRN: [id number]  HPI  [name] is a 50 yr old female who presents today with chief complaint of weight gain.  Weight gain- She reports a 10-12 pound weight gain in the last 1 month.  She is walking every day, 3 miles a day. She is trying to make healthy food choices and eat smaller portions.   Depression- She is frustrated with her mother who suffers from early alzheimers and is living up [place]. The family wants her ,mother to move in with a family member but she is resistant to this.   Pt is overwhelmed by large medical bills.  She is sleeping "ok." she falls asleep but does not stay asleep.  Placed ambien on hold while using muscle relaxant.    Constipation- she reports only having a BM every 2-3 days. Has been using casor oil with some improvement in her symptoms.  Review of Systems    see HPI  Past Medical History  Diagnosis Date  . Hypertension   . Hyperlipidemia   . GERD (gastroesophageal reflux disease)   . Depression   . Asthma   . Eczema   . History of rheumatic fever   . Right shoulder injury     history of  . Mitral stenosis with regurgitation   . Syphilis prior 2006    Pt was treated for + RPR/titer    History   Social History  . Marital Status: Single    Spouse Name: N/A    Number of Children: 3  . Years of Education: N/A   Occupational History  . SALES    Social History Main Topics  . Smoking status: Former Games developer  . Smokeless tobacco: Not on file   Comment: briefly as teenager  . Alcohol Use: No  . Drug Use: No  . Sexually Active: Not on file   Other Topics Concern  . Not on file   Social History Narrative   Regular exercise: yes    Past Surgical History  Procedure Date  . Cesarean section 1991  . Abdominal hysterectomy 01/2004    partial  . Breast surgery 05/2007    left breast biposy  . Femur fracture surgery 1983    right  . Mitral valve replacement  07/16/2010    #46mm Carbomedics mechanical prosthesis via right mini thoracotomy    Family History  Problem Relation Age of Onset  . Heart disease Mother   . Kidney disease Mother   . Diabetes Mother   . Thyroid disease Mother   . Colon polyps Mother   . Heart attack Mother   . Hypertension Mother   . Heart disease Father   . Heart attack Father     No Known Allergies  Current Outpatient Prescriptions on File Prior to Visit  Medication Sig Dispense Refill  . acetaminophen (TYLENOL) 500 MG tablet Take 500 mg by mouth every 6 (six) hours as needed.        Marland Kitchen amLODipine (NORVASC) 5 MG tablet Take 1 tablet (5 mg total) by mouth daily.  30 tablet  11  . aspirin 81 MG EC tablet Take 81 mg by mouth daily.        . cyclobenzaprine (FLEXERIL) 10 MG tablet Take 1 tablet (10 mg total) by mouth every 8 (eight) hours as needed for muscle spasms.  60 tablet  1  . diphenhydrAMINE (BENADRYL) 25 MG tablet Take  25 mg by mouth at bedtime as needed.        Marland Kitchen estradiol (ESTRACE) 1 MG tablet One tablet by mouth daily for 21 days, followed by 7 days off- then repeat.  30 tablet  3  . fluticasone (FLONASE) 50 MCG/ACT nasal spray 2 sprays by Nasal route daily.  16 g  0  . furosemide (LASIX) 40 MG tablet Take 1 tablet (40 mg total) by mouth daily.  30 tablet  12  . gabapentin (NEURONTIN) 300 MG capsule Take 1 capsule (300 mg total) by mouth 3 (three) times daily.  90 capsule  2  . loratadine (CLARITIN) 10 MG tablet Take 10 mg by mouth daily.        . metoprolol (LOPRESSOR) 100 MG tablet Take 1 tablet (100 mg total) by mouth 2 (two) times daily.  60 tablet  11  . omeprazole (PRILOSEC) 20 MG capsule Take 1 capsule (20 mg total) by mouth daily.  30 capsule  5  . potassium chloride (K-DUR) 10 MEQ tablet Take 20 mEq by mouth daily.       . traMADol (ULTRAM) 50 MG tablet       . warfarin (COUMADIN) 5 MG tablet Take as directed by Anticoagulation clinic.  Pt takes up to 1 1/2 tablets daily.  45 tablet  2  .  zolpidem (AMBIEN) 5 MG tablet TAKE ONE TABLET BY MOUTH AT BEDTIME AS NEEDED FOR INSOMNIA.  30 tablet  0    BP 114/82  Pulse 72  Temp(Src) 97.7 F (36.5 C) (Oral)  Resp 16  Wt 190 lb 0.6 oz (86.202 kg)     Objective:   Physical Exam  Constitutional: She appears well-developed and well-nourished. No distress.  Neck: No thyromegaly present.  Cardiovascular: Normal rate and regular rhythm.   No murmur heard. Pulmonary/Chest: Effort normal and breath sounds normal. No respiratory distress. She has no wheezes. She has no rales. She exhibits no tenderness.  Musculoskeletal: She exhibits no edema.  Skin: Skin is warm and dry.  Psychiatric: She has a normal mood and affect. Her behavior is normal. Judgment and thought content normal.          Assessment & Plan:  cymbalta 60 #28 tabs.  01 2014 [id number]

## 2011-01-30 NOTE — Assessment & Plan Note (Signed)
Will check TSH.  Recommended 1200 cal diet with continued regular exercise.

## 2011-01-30 NOTE — Assessment & Plan Note (Signed)
This is deteriorated. I suspect that a lot of this is situational.  Will give her a trial of cymbalta in place of effexor and fluoxetine to help with her depression. This is also weight neutral unlike fluoxetine which may be a contributing factor to her weight gain. Pt to follow up in 4-6 weeks. 25 minutes spent with the patient today.  >50% of this time was spent counseling pt on her depression and weight gain.

## 2011-01-31 LAB — TSH: TSH: 1.165 u[IU]/mL (ref 0.350–4.500)

## 2011-02-02 ENCOUNTER — Encounter: Payer: Self-pay | Admitting: *Deleted

## 2011-02-02 ENCOUNTER — Telehealth: Payer: Self-pay | Admitting: Cardiology

## 2011-02-02 MED ORDER — AMOXICILLIN 500 MG PO TABS: ORAL_TABLET | ORAL | Status: DC

## 2011-02-02 NOTE — Telephone Encounter (Signed)
New message Pt called she said she has abscess tooth an needs an ok to give dentist Please fax back to her at [phone]  Also she needs antibiotics called to her pharmacy [facility] [address] [place]

## 2011-02-02 NOTE — Telephone Encounter (Signed)
Spoke with pt, she is aware of antibiotics and how to take. Note faxed to the number provided Deliah Goody

## 2011-02-03 ENCOUNTER — Other Ambulatory Visit: Payer: Self-pay | Admitting: Family

## 2011-02-04 NOTE — Telephone Encounter (Signed)
Refill left on pharmacy voicemail at [facility] for Zolpidem 5mg  1 tablet as needed at bedtime for insomnia. #30 x no refills.

## 2011-02-13 ENCOUNTER — Encounter: Payer: Self-pay | Admitting: *Deleted

## 2011-02-13 ENCOUNTER — Other Ambulatory Visit: Payer: Self-pay | Admitting: Thoracic Surgery (Cardiothoracic Vascular Surgery)

## 2011-02-17 NOTE — Telephone Encounter (Signed)
Called to pt's pharm 02/17/11 @ 10am

## 2011-02-25 ENCOUNTER — Encounter: Payer: Managed Care, Other (non HMO) | Admitting: *Deleted

## 2011-02-27 ENCOUNTER — Encounter: Payer: Self-pay | Admitting: Family

## 2011-02-27 ENCOUNTER — Other Ambulatory Visit: Payer: Self-pay | Admitting: Family

## 2011-02-27 ENCOUNTER — Ambulatory Visit (INDEPENDENT_AMBULATORY_CARE_PROVIDER_SITE_OTHER): Payer: Managed Care, Other (non HMO) | Admitting: Family

## 2011-02-27 ENCOUNTER — Encounter: Payer: Managed Care, Other (non HMO) | Admitting: *Deleted

## 2011-02-27 VITALS — BP 118/76 | HR 84 | Temp 97.9°F | Resp 16 | Ht 62.0 in | Wt 199.0 lb

## 2011-02-27 DIAGNOSIS — R5383 Other fatigue: Secondary | ICD-10-CM

## 2011-02-27 DIAGNOSIS — R635 Abnormal weight gain: Secondary | ICD-10-CM

## 2011-02-27 DIAGNOSIS — R609 Edema, unspecified: Secondary | ICD-10-CM

## 2011-02-27 DIAGNOSIS — K146 Glossodynia: Secondary | ICD-10-CM | POA: Insufficient documentation

## 2011-02-27 DIAGNOSIS — R5381 Other malaise: Secondary | ICD-10-CM

## 2011-02-27 MED ORDER — VENLAFAXINE HCL ER 37.5 MG PO CP24: 37.5000 mg | ORAL_CAPSULE | Freq: Every day | ORAL | Status: DC

## 2011-02-27 MED ORDER — ZOLPIDEM TARTRATE 5 MG PO TABS: 5.0000 mg | ORAL_TABLET | Freq: Every evening | ORAL | Status: DC | PRN

## 2011-02-27 MED ORDER — LIDOCAINE VISCOUS 2 % MT SOLN: OROMUCOSAL | Status: DC

## 2011-02-27 NOTE — Progress Notes (Signed)
Subjective:    Patient ID: Julia Garza, female    DOB: 05-30-1960, 50 y.o.   MRN: [id number]  HPI  [name] is a 50 yr old female who presents today with several concerns.  1) Fatigue- Not sleeping well at night.  Tired all day. She is not using ambien due to using flexeril and not wanting to mix them.   2) Weight gain-feels swollen. Reports walking regularly and keeping a 1500 kcal diet. She reports mild shortness of breath- was short of breath during thanksgiving. Worse with activity.    3) Tongue pain- had recent dental work (abscessed tooth extracion) capped a tooth- she is waiting for partials.  She notes tongue is painful.     Review of Systems See HPI  Past Medical History  Diagnosis Date  . Hypertension   . Hyperlipidemia   . GERD (gastroesophageal reflux disease)   . Depression   . Asthma   . Eczema   . History of rheumatic fever   . Right shoulder injury     history of  . Mitral stenosis with regurgitation   . Syphilis prior 2006    Pt was treated for + RPR/titer    History   Social History  . Marital Status: Single    Spouse Name: N/A    Number of Children: 3  . Years of Education: N/A   Occupational History  . SALES    Social History Main Topics  . Smoking status: Former Games developer  . Smokeless tobacco: Not on file   Comment: briefly as teenager  . Alcohol Use: No  . Drug Use: No  . Sexually Active: Not on file   Other Topics Concern  . Not on file   Social History Narrative   Regular exercise: yes    Past Surgical History  Procedure Date  . Cesarean section 1991  . Abdominal hysterectomy 01/2004    partial  . Breast surgery 05/2007    left breast biposy  . Femur fracture surgery 1983    right  . Mitral valve replacement 07/16/2010    #76mm Carbomedics mechanical prosthesis via right mini thoracotomy    Family History  Problem Relation Age of Onset  . Heart disease Mother   . Kidney disease Mother   . Diabetes Mother   . Thyroid  disease Mother   . Colon polyps Mother   . Heart attack Mother   . Hypertension Mother   . Heart disease Father   . Heart attack Father     No Known Allergies  Current Outpatient Prescriptions on File Prior to Visit  Medication Sig Dispense Refill  . acetaminophen (TYLENOL) 500 MG tablet Take 500 mg by mouth every 6 (six) hours as needed.        Marland Kitchen amLODipine (NORVASC) 5 MG tablet Take 1 tablet (5 mg total) by mouth daily.  30 tablet  11  . aspirin 81 MG EC tablet Take 81 mg by mouth daily.        . cyclobenzaprine (FLEXERIL) 10 MG tablet Take 1 tablet (10 mg total) by mouth every 8 (eight) hours as needed for muscle spasms.  60 tablet  1  . DULoxetine (CYMBALTA) 60 MG capsule Take 1 capsule (60 mg total) by mouth daily.  28 capsule  0  . estradiol (ESTRACE) 1 MG tablet One tablet by mouth daily for 21 days, followed by 7 days off- then repeat.  30 tablet  3  . fluticasone (FLONASE) 50 MCG/ACT nasal spray  2 sprays by Nasal route daily.  16 g  0  . furosemide (LASIX) 40 MG tablet Take 1 tablet (40 mg total) by mouth daily.  30 tablet  12  . gabapentin (NEURONTIN) 300 MG capsule Take 1 capsule (300 mg total) by mouth 3 (three) times daily.  90 capsule  2  . HYDROcodone-acetaminophen (VICODIN) 5-500 MG per tablet TAKE ONE TABLET BY MOUTH EVERY 4 TO 6 HOURS AS NEEDED FOR PAIN  40 tablet  0  . loratadine (CLARITIN) 10 MG tablet Take 10 mg by mouth daily.        . metoprolol (LOPRESSOR) 100 MG tablet Take 1 tablet (100 mg total) by mouth 2 (two) times daily.  60 tablet  11  . omeprazole (PRILOSEC) 20 MG capsule Take 1 capsule (20 mg total) by mouth daily.  30 capsule  5  . potassium chloride (K-DUR) 10 MEQ tablet Take 20 mEq by mouth daily.       Marland Kitchen warfarin (COUMADIN) 5 MG tablet Take as directed by Anticoagulation clinic.  Pt takes up to 1 1/2 tablets daily.  45 tablet  2  . amoxicillin (AMOXIL) 500 MG tablet Take all four tablets one hour prior to dental appt  4 tablet  6    BP 118/76   Pulse 84  Temp(Src) 97.9 F (36.6 C) (Oral)  Resp 16  Ht 5\' 2"  (1.575 m)  Wt 199 lb (90.266 kg)  BMI 36.40 kg/m2       Objective:   Physical Exam  Constitutional: She appears well-developed and well-nourished. No distress.  HENT:  Head: Normocephalic and atraumatic.       Mild irritation noted on left lateral aspect of tongue, no swelling is noted.    Eyes: Conjunctivae are normal. Pupils are equal, round, and reactive to light.  Neck: Normal range of motion. Neck supple. No thyromegaly present.  Cardiovascular: Normal rate and regular rhythm.   No murmur heard. Pulmonary/Chest: Effort normal and breath sounds normal. No respiratory distress. She has no wheezes. She has no rales. She exhibits no tenderness.  Abdominal: Soft. Bowel sounds are normal. She exhibits no distension. There is no tenderness.  Musculoskeletal: She exhibits no edema.  Skin: Skin is warm and dry. No rash noted. No erythema. No pallor.  Psychiatric: She has a normal mood and affect. Her behavior is normal. Judgment and thought content normal.          Assessment & Plan:

## 2011-02-27 NOTE — Telephone Encounter (Signed)
Refill Ambien #30 x no refills called to [facility].

## 2011-02-27 NOTE — Assessment & Plan Note (Signed)
This seems to have started after she began cymbalta.  Will switch her to effexor and see if this helps her drowsiness and hopefully continue to help her depression.

## 2011-02-27 NOTE — Assessment & Plan Note (Signed)
I think this is related to her recent dental work.  Recommended viscous lidocaine prn.

## 2011-02-27 NOTE — Patient Instructions (Signed)
Please complete your lab work on the first floor. Follow up in 1 month, sooner if problems or concerns. Weigh yourself daily,  call if continued weight gain or worsening shortness of breath.

## 2011-02-27 NOTE — Assessment & Plan Note (Signed)
Her weight has fluctuated over the last year.  She is up 15 pounds since June, most of which she has gained in the last 2 months.  Her TSH is normal and she is off of her SRI.  While her lungs are clear, I am concerned about fluid retention.  Obtain BNP.  Pt was counseled on diet and exercise.  I have also advised her to follow up with Dr. Jens Som.

## 2011-03-01 LAB — BRAIN NATRIURETIC PEPTIDE: Brain Natriuretic Peptide: 42.4 pg/mL (ref 0.0–100.0)

## 2011-03-02 ENCOUNTER — Encounter: Payer: Self-pay | Admitting: Family

## 2011-03-02 ENCOUNTER — Emergency Department (HOSPITAL_BASED_OUTPATIENT_CLINIC_OR_DEPARTMENT_OTHER)
Admission: EM | Admit: 2011-03-02 | Discharge: 2011-03-02 | Discharge status: Against Medical Advice | Payer: Managed Care, Other (non HMO) | Attending: Emergency Medicine | Admitting: Emergency Medicine

## 2011-03-02 ENCOUNTER — Ambulatory Visit (INDEPENDENT_AMBULATORY_CARE_PROVIDER_SITE_OTHER): Payer: Managed Care, Other (non HMO) | Admitting: Family

## 2011-03-02 ENCOUNTER — Encounter (HOSPITAL_BASED_OUTPATIENT_CLINIC_OR_DEPARTMENT_OTHER): Payer: Self-pay

## 2011-03-02 ENCOUNTER — Telehealth: Payer: Self-pay | Admitting: Family

## 2011-03-02 DIAGNOSIS — K1370 Unspecified lesions of oral mucosa: Secondary | ICD-10-CM | POA: Insufficient documentation

## 2011-03-02 HISTORY — DX: Herpesviral infection, unspecified: B00.9

## 2011-03-02 MED ORDER — ACYCLOVIR 400 MG PO TABS: 400.0000 mg | ORAL_TABLET | Freq: Three times a day (TID) | ORAL | Status: AC

## 2011-03-02 NOTE — Telephone Encounter (Signed)
Pt seen in the office today.  

## 2011-03-02 NOTE — ED Notes (Signed)
Pt states that last Thursday she had onset of ulcers to the bottom of her tongue, was seen by Sandford Craze, FNP upstairs and was given lidocaine rinse, and sent to [facility] on Saturday and was given Amoxicillin, no improvement.  Pt states that she still has severe pain, and would like to find out what these spots are and how to treat them.

## 2011-03-02 NOTE — Assessment & Plan Note (Signed)
I do not think that she has an infection- advised her to stop amoxicillin.  I suspect viral etiology.  Recommended that she alternate salt water gargles with viscous lidocaine.  Trial of Acyclovir to see if this helps.

## 2011-03-02 NOTE — Patient Instructions (Signed)
Please call if symptoms worsen or if no improvement in 2-3 days.  

## 2011-03-02 NOTE — Telephone Encounter (Signed)
Patient states that her mouth is worse since last Friday. There are more blisters now. Susette says that she went to [facility] on [address] over the weekend and they prescriber her amoxicillan and that has not helped. Patient is unsure what to do and would like something else called in to [facility] on [address] in [place]

## 2011-03-02 NOTE — Progress Notes (Signed)
Subjective:    Patient ID: Julia Garza, female    DOB: 1960/06/23, 50 y.o.   MRN: [id number]  HPI [name] is a 50 yr old female who presents today with chief complaint of tongue pain/blisters. She reports that she has been using the lidocaine which initially burns, but then effectively numbs her mouth.  She went to an urgent care over the weekend and was given amoxicillin.  Pt reports pain is worse no improvement with amoxicillin.      Review of Systems  See HPI  Past Medical History  Diagnosis Date  . Hypertension   . Hyperlipidemia   . GERD (gastroesophageal reflux disease)   . Depression   . Asthma   . Eczema   . History of rheumatic fever   . Right shoulder injury     history of  . Mitral stenosis with regurgitation   . Syphilis prior 2006    Pt was treated for + RPR/titer  . Herpes     History   Social History  . Marital Status: Single    Spouse Name: N/A    Number of Children: 3  . Years of Education: N/A   Occupational History  . SALES    Social History Main Topics  . Smoking status: Former Games developer  . Smokeless tobacco: Not on file   Comment: briefly as teenager  . Alcohol Use: No  . Drug Use: No  . Sexually Active: Not on file   Other Topics Concern  . Not on file   Social History Narrative   Regular exercise: yes    Past Surgical History  Procedure Date  . Cesarean section 1991  . Abdominal hysterectomy 01/2004    partial  . Breast surgery 05/2007    left breast biposy  . Femur fracture surgery 1983    right  . Mitral valve replacement 07/16/2010    #30mm Carbomedics mechanical prosthesis via right mini thoracotomy    Family History  Problem Relation Age of Onset  . Heart disease Mother   . Kidney disease Mother   . Diabetes Mother   . Thyroid disease Mother   . Colon polyps Mother   . Heart attack Mother   . Hypertension Mother   . Heart disease Father   . Heart attack Father     No Known Allergies  Current Outpatient  Prescriptions on File Prior to Visit  Medication Sig Dispense Refill  . acetaminophen (TYLENOL) 500 MG tablet Take 500 mg by mouth every 6 (six) hours as needed.        Marland Kitchen amLODipine (NORVASC) 5 MG tablet Take 1 tablet (5 mg total) by mouth daily.  30 tablet  11  . amoxicillin (AMOXIL) 500 MG tablet 500 mg 2 (two) times daily.        Marland Kitchen aspirin 81 MG EC tablet Take 81 mg by mouth daily.        . cyclobenzaprine (FLEXERIL) 10 MG tablet Take 1 tablet (10 mg total) by mouth every 8 (eight) hours as needed for muscle spasms.  60 tablet  1  . DULoxetine (CYMBALTA) 60 MG capsule Take 1 capsule (60 mg total) by mouth daily.  28 capsule  0  . estradiol (ESTRACE) 1 MG tablet One tablet by mouth daily for 21 days, followed by 7 days off- then repeat.  30 tablet  3  . fluticasone (FLONASE) 50 MCG/ACT nasal spray 2 sprays by Nasal route daily.  16 g  0  . furosemide (LASIX)  40 MG tablet Take 1 tablet (40 mg total) by mouth daily.  30 tablet  12  . gabapentin (NEURONTIN) 300 MG capsule Take 1 capsule (300 mg total) by mouth 3 (three) times daily.  90 capsule  2  . HYDROcodone-acetaminophen (VICODIN) 5-500 MG per tablet TAKE ONE TABLET BY MOUTH EVERY 4 TO 6 HOURS AS NEEDED FOR PAIN  40 tablet  0  . lidocaine (XYLOCAINE) 2 % solution 2 teaspoons swish and spit 3 times daily as needed for mouth pain.  200 mL  0  . loratadine (CLARITIN) 10 MG tablet Take 10 mg by mouth daily.        . metoprolol (LOPRESSOR) 100 MG tablet Take 1 tablet (100 mg total) by mouth 2 (two) times daily.  60 tablet  11  . omeprazole (PRILOSEC) 20 MG capsule Take 1 capsule (20 mg total) by mouth daily.  30 capsule  5  . potassium chloride (K-DUR) 10 MEQ tablet Take 20 mEq by mouth daily.       Marland Kitchen venlafaxine (EFFEXOR-XR) 37.5 MG 24 hr capsule Take 1 capsule (37.5 mg total) by mouth daily.  30 capsule  2  . warfarin (COUMADIN) 5 MG tablet Take as directed by Anticoagulation clinic.  Pt takes up to 1 1/2 tablets daily.  45 tablet  2  . zolpidem  (AMBIEN) 5 MG tablet Take 1 tablet (5 mg total) by mouth at bedtime as needed for sleep.  30 tablet  0    BP 114/80  Pulse 84  Temp(Src) 97.7 F (36.5 C) (Axillary)  Resp 18  Wt 196 lb (88.905 kg)        Objective:   Physical Exam  Constitutional: She appears well-developed and well-nourished.  HENT:  Right Ear: Tympanic membrane and ear canal normal.  Left Ear: Tympanic membrane and ear canal normal.  Mouth/Throat: Uvula is midline and oropharynx is clear and moist. No dental abscesses. No posterior oropharyngeal edema, posterior oropharyngeal erythema or tonsillar abscesses.       Some irritation of the left buccal mucosa.  Small white lesions/irritation noted on underside of tongue as well as some irritation noted on the inside of lip.            Assessment & Plan:

## 2011-03-04 ENCOUNTER — Other Ambulatory Visit: Payer: Self-pay | Admitting: Thoracic Surgery (Cardiothoracic Vascular Surgery)

## 2011-03-05 ENCOUNTER — Other Ambulatory Visit: Payer: Self-pay | Admitting: Thoracic Surgery (Cardiothoracic Vascular Surgery)

## 2011-03-06 ENCOUNTER — Other Ambulatory Visit: Payer: Self-pay | Admitting: *Deleted

## 2011-03-06 ENCOUNTER — Encounter: Payer: Self-pay | Admitting: *Deleted

## 2011-03-06 DIAGNOSIS — G8918 Other acute postprocedural pain: Secondary | ICD-10-CM

## 2011-03-06 MED ORDER — HYDROCODONE-ACETAMINOPHEN 5-500 MG PO TABS: 1.0000 | ORAL_TABLET | ORAL | Status: DC | PRN

## 2011-03-09 ENCOUNTER — Ambulatory Visit: Payer: Managed Care, Other (non HMO) | Admitting: Family

## 2011-03-16 ENCOUNTER — Telehealth: Payer: Self-pay | Admitting: Family

## 2011-03-16 ENCOUNTER — Encounter: Payer: Self-pay | Admitting: Family

## 2011-03-16 MED ORDER — LIDOCAINE VISCOUS 2 % MT SOLN: OROMUCOSAL | Status: AC

## 2011-03-17 ENCOUNTER — Telehealth: Payer: Self-pay | Admitting: Family

## 2011-03-17 MED ORDER — ACYCLOVIR 400 MG PO TABS: 400.0000 mg | ORAL_TABLET | Freq: Three times a day (TID) | ORAL | Status: AC

## 2011-03-20 ENCOUNTER — Other Ambulatory Visit: Payer: Self-pay | Admitting: Family

## 2011-03-20 NOTE — Telephone Encounter (Signed)
rx  Called in, not to be filled prior to 12/29.

## 2011-03-31 ENCOUNTER — Other Ambulatory Visit: Payer: Self-pay | Admitting: Family Medicine

## 2011-04-02 ENCOUNTER — Telehealth: Payer: Self-pay | Admitting: Cardiology

## 2011-04-02 NOTE — Telephone Encounter (Signed)
New Problem:     Patient called in because she wanted to see Dr. Jens Som because she had a mitral valve replace ment last April and she still has shortness of breath and her legs continue to swell despite taking her Lasix. Please advise.

## 2011-04-02 NOTE — Telephone Encounter (Signed)
Spoke with pt, she is post MVR, she cont to have problems with swelling. She reports she has gained weight and has taken the lasix as recommended but cont to have problems and wants to discuss with [name]. Follow up appt made

## 2011-04-03 ENCOUNTER — Encounter: Payer: Self-pay | Admitting: Family

## 2011-04-03 ENCOUNTER — Other Ambulatory Visit: Payer: Self-pay | Admitting: Family

## 2011-04-03 ENCOUNTER — Ambulatory Visit (INDEPENDENT_AMBULATORY_CARE_PROVIDER_SITE_OTHER): Payer: Managed Care, Other (non HMO) | Admitting: *Deleted

## 2011-04-03 DIAGNOSIS — Z954 Presence of other heart-valve replacement: Secondary | ICD-10-CM

## 2011-04-03 DIAGNOSIS — Z7901 Long term (current) use of anticoagulants: Secondary | ICD-10-CM

## 2011-04-03 DIAGNOSIS — I052 Rheumatic mitral stenosis with insufficiency: Secondary | ICD-10-CM

## 2011-04-03 NOTE — Telephone Encounter (Signed)
Please call pt and let her know that I think she needs to be seen.  She should go to urgent care or ED please.

## 2011-04-04 ENCOUNTER — Encounter: Payer: Self-pay | Admitting: Family

## 2011-04-06 ENCOUNTER — Telehealth: Payer: Self-pay | Admitting: *Deleted

## 2011-04-06 ENCOUNTER — Encounter: Payer: Self-pay | Admitting: Family

## 2011-04-06 MED ORDER — ZOLPIDEM TARTRATE 5 MG PO TABS: 5.0000 mg | ORAL_TABLET | Freq: Every evening | ORAL | Status: DC | PRN

## 2011-04-06 NOTE — Telephone Encounter (Signed)
Received call from pt stating she is having increase of depression. Appt scheduled for 04/07/11 at 10:45am with Sandford Craze, NP. Pt also states she has been out of zolpidem as the pharmacy told her we only called in # 3 on 03/20/11. Advised pt we called in # 30 and I would get rx corrected. Spoke to Harrington at [facility]; he states it was taken off of voicemail for #3, he will correct to #30 x no refills.

## 2011-04-07 ENCOUNTER — Ambulatory Visit: Payer: Managed Care, Other (non HMO) | Admitting: Family

## 2011-04-08 ENCOUNTER — Telehealth: Payer: Self-pay | Admitting: Family

## 2011-04-08 ENCOUNTER — Encounter: Payer: Self-pay | Admitting: Family

## 2011-04-08 ENCOUNTER — Ambulatory Visit (INDEPENDENT_AMBULATORY_CARE_PROVIDER_SITE_OTHER): Payer: Managed Care, Other (non HMO) | Admitting: Cardiology

## 2011-04-08 ENCOUNTER — Ambulatory Visit (INDEPENDENT_AMBULATORY_CARE_PROVIDER_SITE_OTHER): Payer: Managed Care, Other (non HMO) | Admitting: Family

## 2011-04-08 ENCOUNTER — Encounter: Payer: Self-pay | Admitting: Cardiology

## 2011-04-08 ENCOUNTER — Encounter (HOSPITAL_BASED_OUTPATIENT_CLINIC_OR_DEPARTMENT_OTHER): Payer: Self-pay | Admitting: *Deleted

## 2011-04-08 ENCOUNTER — Inpatient Hospital Stay (HOSPITAL_BASED_OUTPATIENT_CLINIC_OR_DEPARTMENT_OTHER)
Admission: EM | Admit: 2011-04-08 | Discharge: 2011-04-11 | DRG: 378 | Disposition: A | Discharge status: Home / Self Care | Payer: Managed Care, Other (non HMO) | Source: Ambulatory Visit | Attending: Internal Medicine | Admitting: Internal Medicine

## 2011-04-08 ENCOUNTER — Other Ambulatory Visit: Payer: Self-pay

## 2011-04-08 VITALS — BP 134/78 | HR 96 | Temp 97.8°F | Resp 18 | Wt 195.0 lb

## 2011-04-08 DIAGNOSIS — Z952 Presence of prosthetic heart valve: Secondary | ICD-10-CM

## 2011-04-08 DIAGNOSIS — I359 Nonrheumatic aortic valve disorder, unspecified: Secondary | ICD-10-CM

## 2011-04-08 DIAGNOSIS — D62 Acute posthemorrhagic anemia: Secondary | ICD-10-CM | POA: Diagnosis present

## 2011-04-08 DIAGNOSIS — G8918 Other acute postprocedural pain: Secondary | ICD-10-CM

## 2011-04-08 DIAGNOSIS — Z7901 Long term (current) use of anticoagulants: Secondary | ICD-10-CM

## 2011-04-08 DIAGNOSIS — E785 Hyperlipidemia, unspecified: Secondary | ICD-10-CM | POA: Diagnosis present

## 2011-04-08 DIAGNOSIS — R58 Hemorrhage, not elsewhere classified: Secondary | ICD-10-CM

## 2011-04-08 DIAGNOSIS — I509 Heart failure, unspecified: Secondary | ICD-10-CM

## 2011-04-08 DIAGNOSIS — K219 Gastro-esophageal reflux disease without esophagitis: Secondary | ICD-10-CM | POA: Diagnosis present

## 2011-04-08 DIAGNOSIS — N179 Acute kidney failure, unspecified: Secondary | ICD-10-CM

## 2011-04-08 DIAGNOSIS — K625 Hemorrhage of anus and rectum: Secondary | ICD-10-CM

## 2011-04-08 DIAGNOSIS — I10 Essential (primary) hypertension: Secondary | ICD-10-CM

## 2011-04-08 DIAGNOSIS — J45909 Unspecified asthma, uncomplicated: Secondary | ICD-10-CM | POA: Diagnosis present

## 2011-04-08 DIAGNOSIS — K922 Gastrointestinal hemorrhage, unspecified: Principal | ICD-10-CM | POA: Diagnosis present

## 2011-04-08 DIAGNOSIS — F329 Major depressive disorder, single episode, unspecified: Secondary | ICD-10-CM

## 2011-04-08 DIAGNOSIS — D649 Anemia, unspecified: Secondary | ICD-10-CM

## 2011-04-08 DIAGNOSIS — L259 Unspecified contact dermatitis, unspecified cause: Secondary | ICD-10-CM | POA: Diagnosis present

## 2011-04-08 DIAGNOSIS — R0602 Shortness of breath: Secondary | ICD-10-CM

## 2011-04-08 DIAGNOSIS — Z954 Presence of other heart-valve replacement: Secondary | ICD-10-CM

## 2011-04-08 DIAGNOSIS — R531 Weakness: Secondary | ICD-10-CM

## 2011-04-08 DIAGNOSIS — Z87891 Personal history of nicotine dependence: Secondary | ICD-10-CM

## 2011-04-08 LAB — CBC
HCT: 17.7 % — ABNORMAL LOW (ref 36.0–46.0)
Hemoglobin: 5.2 g/dL — CL (ref 12.0–15.0)
MCV: 90.3 fL (ref 78.0–100.0)
RBC: 1.96 MIL/uL — ABNORMAL LOW (ref 3.87–5.11)
RDW: 16.1 % — ABNORMAL HIGH (ref 11.5–15.5)
WBC: 9.6 10*3/uL (ref 4.0–10.5)

## 2011-04-08 LAB — CBC WITH DIFFERENTIAL/PLATELET
Basophils Relative: 1 % (ref 0–1)
Eosinophils Absolute: 0.1 10*3/uL (ref 0.0–0.7)
MCH: 26.8 pg (ref 26.0–34.0)
MCHC: 29.4 g/dL — ABNORMAL LOW (ref 30.0–36.0)
Neutrophils Relative %: 62 % (ref 43–77)
Platelets: 812 10*3/uL — ABNORMAL HIGH (ref 150–400)
RBC: 2.2 MIL/uL — ABNORMAL LOW (ref 3.87–5.11)

## 2011-04-08 LAB — DIFFERENTIAL
Basophils Absolute: 0.1 10*3/uL (ref 0.0–0.1)
Basophils Relative: 1 % (ref 0–1)
Eosinophils Relative: 1 % (ref 0–5)
Lymphocytes Relative: 27 % (ref 12–46)
Lymphs Abs: 2.6 10*3/uL (ref 0.7–4.0)
Monocytes Relative: 11 % (ref 3–12)
Neutro Abs: 5.7 10*3/uL (ref 1.7–7.7)

## 2011-04-08 LAB — PROTIME-INR: Prothrombin Time: 25.6 seconds — ABNORMAL HIGH (ref 11.6–15.2)

## 2011-04-08 LAB — BASIC METABOLIC PANEL
BUN: 21 mg/dL (ref 6–23)
CO2: 24 mEq/L (ref 19–32)
Calcium: 9.2 mg/dL (ref 8.4–10.5)
Chloride: 99 mEq/L (ref 96–112)
Creatinine, Ser: 1.5 mg/dL — ABNORMAL HIGH (ref 0.50–1.10)
Glucose, Bld: 114 mg/dL — ABNORMAL HIGH (ref 70–99)

## 2011-04-08 LAB — TROPONIN I: Troponin I: <0.3 ng/mL (ref <0.30)

## 2011-04-08 MED ORDER — SODIUM CHLORIDE 0.9 % IV SOLN: INTRAVENOUS | Status: DC

## 2011-04-08 MED ORDER — ACETAMINOPHEN 325 MG PO TABS
650.0000 mg | ORAL_TABLET | Freq: Once | ORAL | Status: AC
  Administered 2011-04-08: 650 mg via ORAL

## 2011-04-08 MED ORDER — BUPROPION HCL ER (SR) 150 MG PO TB12: 150.0000 mg | ORAL_TABLET | Freq: Two times a day (BID) | ORAL | Status: DC

## 2011-04-08 MED ORDER — SODIUM CHLORIDE 0.9 % IV SOLN
INTRAVENOUS | Status: DC
  Administered 2011-04-08: 23:00:00 via INTRAVENOUS

## 2011-04-08 MED ORDER — FUROSEMIDE 40 MG PO TABS: ORAL_TABLET | ORAL | Status: DC

## 2011-04-08 MED ORDER — ACETAMINOPHEN 325 MG PO TABS
ORAL_TABLET | ORAL | Status: AC
  Filled 2011-04-08: qty 2

## 2011-04-08 MED ORDER — VENLAFAXINE HCL ER 75 MG PO CP24: 75.0000 mg | ORAL_CAPSULE | Freq: Every day | ORAL | Status: DC

## 2011-04-08 NOTE — Assessment & Plan Note (Signed)
I am concerned about her having severe anemia given heme positive stool on coumadin, pale conjunctiva, shortness of breath and hair loss.  Obtain stat CBC today.

## 2011-04-08 NOTE — Assessment & Plan Note (Signed)
Deteriorated.  Will increase effexor to 75mg  and add wellbutrin.

## 2011-04-08 NOTE — Progress Notes (Signed)
Subjective:    Patient ID: Julia Garza, female    DOB: [birth year], 51 y.o.   MRN: [id number]  HPI [name] is a 51 yr old female who presents today to discuss depression.  She is currently on Effexor 37.5mg  once daily. Sleeping better with ambien.  Reports poor concentration.  She stays out of breath.  + tearfulness.  Notes some binge/stress eating.  She stopped cymbalta due to cost.  Hair loss- She is concerned about recent hair loss.  She reports +pica  Shortness of Breath- She is scheduled to see Dr. Jens Som to discuss her SOB today. She has noticed shortness of breath over the last few weeks.   Rectal bleeding-  After further questioning, pt reports that she has had some rectal bleeding.  Generally on the tissue only, but a few days ago she saw cherry colored water in the toilet bowl.  Reports  + hx of hemorrhoids, but haven't bothered her recently.    Review of Systems See hpi  Past Medical History  Diagnosis Date  . Hypertension   . Hyperlipidemia   . GERD (gastroesophageal reflux disease)   . Depression   . Asthma   . Eczema   . History of rheumatic fever   . Right shoulder injury     history of  . Mitral stenosis with regurgitation   . Syphilis prior 2006    Pt was treated for + RPR/titer  . Herpes     History   Social History  . Marital Status: Single    Spouse Name: N/A    Number of Children: 3  . Years of Education: N/A   Occupational History  . SALES    Social History Main Topics  . Smoking status: Former Games developer  . Smokeless tobacco: Not on file   Comment: briefly as teenager  . Alcohol Use: No  . Drug Use: No  . Sexually Active: Not on file   Other Topics Concern  . Not on file   Social History Narrative   Regular exercise: yes    Past Surgical History  Procedure Date  . Cesarean section 1991  . Abdominal hysterectomy 01/2004    partial  . Breast surgery 05/2007    left breast biposy  . Femur fracture surgery 1983    right  .  Mitral valve replacement 07/16/2010    #67mm Carbomed[employer] mechanical prosthesis via right mini thoracotomy    Family History  Problem Relation Age of Onset  . Heart disease Mother   . Kidney disease Mother   . Diabetes Mother   . Thyroid disease Mother   . Colon polyps Mother   . Heart attack Mother   . Hypertension Mother   . Heart disease Father   . Heart attack Father     No Known Allergies  Current Outpatient Prescriptions on File Prior to Visit  Medication Sig Dispense Refill  . acetaminophen (TYLENOL) 500 MG tablet Take 500 mg by mouth every 6 (six) hours as needed.        Marland Kitchen amLODipine (NORVASC) 5 MG tablet Take 1 tablet (5 mg total) by mouth daily.  30 tablet  11  . aspirin 81 MG EC tablet Take 81 mg by mouth daily.        . cyclobenzaprine (FLEXERIL) 10 MG tablet TAKE ONE TABLET BY MOUTH EVERY 8 HOURS AS NEEDED FOR MUSCLE SPASMS  60 tablet  0  . estradiol (ESTRACE) 1 MG tablet One tablet by mouth daily for 21  days, followed by 7 days off- then repeat.  30 tablet  3  . fluticasone (FLONASE) 50 MCG/ACT nasal spray USE ONE SPRAY EACH NOSTRIL EVERY DAY  16 g  2  . gabapentin (NEURONTIN) 300 MG capsule Take 1 capsule (300 mg total) by mouth 3 (three) times daily.  90 capsule  2  . lidocaine (XYLOCAINE) 2 % solution 2 teaspoons swish and spit 3 times daily as needed for mouth pain.  200 mL  0  . loratadine (CLARITIN) 10 MG tablet Take 10 mg by mouth daily.        . metoprolol (LOPRESSOR) 100 MG tablet Take 1 tablet (100 mg total) by mouth 2 (two) times daily.  60 tablet  11  . omeprazole (PRILOSEC) 20 MG capsule Take 1 capsule (20 mg total) by mouth daily.  30 capsule  5  . potassium chloride (K-DUR) 10 MEQ tablet Take 20 mEq by mouth daily.       Marland Kitchen warfarin (COUMADIN) 5 MG tablet Take as directed by Anticoagulation clinic.  Pt takes up to 1 1/2 tablets daily.  45 tablet  2  . zolpidem (AMBIEN) 5 MG tablet Take 1 tablet (5 mg total) by mouth at bedtime as needed for sleep.  30  tablet  0  . DISCONTD: furosemide (LASIX) 40 MG tablet Take 1 tablet (40 mg total) by mouth daily.  30 tablet  12  . furosemide (LASIX) 40 MG tablet Take 1 and 1/2 tablets twice daily  75 tablet  12    BP 134/78  Pulse 96  Temp(Src) 97.8 F (36.6 C) (Oral)  Resp 18  Wt 195 lb (88.451 kg)  SpO2 100%       Objective:   Physical Exam  Constitutional: She appears well-developed and well-nourished. No distress.  HENT:  Head: Normocephalic and atraumatic.  Eyes:       Conjunctiva very pale.   Cardiovascular: Normal rate.   No murmur heard.      + click  Genitourinary: Guaiac positive stool.       Normal rectal exam   Skin:       Hair on head is soft and thinning on top.  Psychiatric: Her behavior is normal. Judgment and thought content normal. Her mood appears not anxious. Her affect is blunt. Her affect is not angry, not labile and not inappropriate. Her speech is not rapid and/or pressured. Cognition and memory are normal. She exhibits a depressed mood.          Assessment & Plan:

## 2011-04-08 NOTE — Patient Instructions (Addendum)
Please follow up in 1 month.  Complete your blood work prior to leaving today.

## 2011-04-08 NOTE — Assessment & Plan Note (Signed)
Patient with increased volume. She states her symptoms are improving as she increased her Lasix to 40 mg twice a day. Plan repeat echocardiogram. Check potassium, renal function and BNP. Increase Lasix further to 60 mg twice a day. Check potassium and renal function in one week. She apparently has been found to have heme positive stool and has significant pallor on examination. Anemia may be contributing to CHF. Check hemoglobin.

## 2011-04-08 NOTE — Patient Instructions (Signed)
Your physician recommends that you schedule a follow-up appointment in: 2-4 WEEKS  Your physician recommends that you return for lab work in: TODAY AND IN ONE WEEK  Your physician has requested that you have an echocardiogram. Echocardiography is a painless test that uses sound waves to create images of your heart. It provides your doctor with information about the size and shape of your heart and how well your heart's chambers and valves are working. This procedure takes approximately one hour. There are no restrictions for this procedure.   INCREASE FUROSEMIDE TO 40 MG ONE AND ONE HALF TABLET TWICE DAILY

## 2011-04-08 NOTE — Assessment & Plan Note (Signed)
Blood pressure controlled. Continue present medications. 

## 2011-04-08 NOTE — Assessment & Plan Note (Signed)
Plan continue SBE prophylaxis. Repeat echocardiogram. 

## 2011-04-08 NOTE — ED Provider Notes (Addendum)
History     CSN: [id number]  Arrival date & time 04/08/11  2045   First MD Initiated Contact with Patient 04/08/11 2057      Chief Complaint  Patient presents with  . Rectal Bleeding    (Consider location/radiation/quality/duration/timing/severity/associated sxs/prior treatment) HPI  50yoF h/o mitral valve replacement on Coumadin presents with concern for rectal bleeding. Patient states she was seen by her primary care provider today with hemoglobin of 5.9. She was referred to the emergency department for further workup and evaluation. She states that over the past week she's experienced darker stools than normal. She states that in the office she was Hemoccult-positive. She complains of diffuse weakness, shortness of breath for about a week as well. It is not worse with exertion. She denies heavy vaginal bleeding and states that she's had abdominal hysterectomy in the past. She denies abdominal pain, nausea, vomiting. There is no headache or recent head trauma. She complains of lightheadedness which has been present also for 1 week. She denies numbness, tingling, weakness of her extremity. There is no chest pain or back pain. She denies fever/chills/body aches/uri sx.     Past Medical History  Diagnosis Date  . Hypertension   . Hyperlipidemia   . GERD (gastroesophageal reflux disease)   . Depression   . Asthma   . Eczema   . History of rheumatic fever   . Right shoulder injury     history of  . Mitral stenosis with regurgitation   . Syphilis prior 2006    Pt was treated for + RPR/titer  . Herpes     Past Surgical History  Procedure Date  . Cesarean section 1991  . Abdominal hysterectomy 01/2004    partial  . Breast surgery 05/2007    left breast biposy  . Femur fracture surgery 1983    right  . Mitral valve replacement 07/16/2010    #59mm [employer]omed[employer] mechanical prosthesis via right mini thoracotomy    Family History  Problem Relation Age of Onset  . Heart disease  Mother   . Kidney disease Mother   . Diabetes Mother   . Thyroid disease Mother   . Colon polyps Mother   . Heart attack Mother   . Hypertension Mother   . Heart disease Father   . Heart attack Father     History  Substance Use Topics  . Smoking status: Former Games developer  . Smokeless tobacco: Not on file   Comment: briefly as teenager  . Alcohol Use: No    OB History    Grav Para Term Preterm Abortions TAB SAB Ect Mult Living                  Review of Systems  All other systems reviewed and are negative.   except as noted HPI   Allergies  Review of patient's allergies indicates no known allergies.  Home Medications   Current Outpatient Rx  Name Route Sig Dispense Refill  . ACETAMINOPHEN 500 MG PO TABS Oral Take 500 mg by mouth every 6 (six) hours as needed.      Marland Kitchen AMLODIPINE BESYLATE 5 MG PO TABS Oral Take 1 tablet (5 mg total) by mouth daily. 30 tablet 11  . ASPIRIN 81 MG PO TBEC Oral Take 81 mg by mouth daily.      . BUPROPION HCL ER (SR) 150 MG PO TB12 Oral Take 1 tablet (150 mg total) by mouth 2 (two) times daily. 60 tablet 1  . CYCLOBENZAPRINE HCL  10 MG PO TABS  TAKE ONE TABLET BY MOUTH EVERY 8 HOURS AS NEEDED FOR MUSCLE SPASMS 60 tablet 0  . ESTRADIOL 1 MG PO TABS  One tablet by mouth daily for 21 days, followed by 7 days off- then repeat. 30 tablet 3  . FLUTICASONE PROPIONATE 50 MCG/ACT NA SUSP  USE ONE SPRAY EACH NOSTRIL EVERY DAY 16 g 2  . FUROSEMIDE 40 MG PO TABS Oral Take 60 mg by mouth 2 (two) times daily.    Marland Kitchen GABAPENTIN 300 MG PO CAPS Oral Take 1 capsule (300 mg total) by mouth 3 (three) times daily. 90 capsule 2  . LIDOCAINE VISCOUS 2 % MT SOLN  2 teaspoons swish and spit 3 times daily as needed for mouth pain. 200 mL 0  . LORATADINE 10 MG PO TABS Oral Take 10 mg by mouth daily as needed. For seasonal allergies    . METOPROLOL TARTRATE 100 MG PO TABS Oral Take 1 tablet (100 mg total) by mouth 2 (two) times daily. 60 tablet 11  . OMEPRAZOLE 20 MG PO CPDR  Oral Take 1 capsule (20 mg total) by mouth daily. 30 capsule 5  . POTASSIUM CHLORIDE ER 10 MEQ PO TBCR Oral Take 10 mEq by mouth 2 (two) times daily.     . VENLAFAXINE HCL ER 75 MG PO CP24 Oral Take 1 capsule (75 mg total) by mouth daily. 30 capsule 2  . WARFARIN SODIUM 5 MG PO TABS Oral Take 5-7.5 mg by mouth daily. Take 1 tab on Sunday and Thursday. Take 1 & 1/2 tabs on Monday, Tuesday, Wednesday, Friday and Saturday    . ZOLPIDEM TARTRATE 5 MG PO TABS Oral Take 5 mg by mouth at bedtime.      BP 122/71  Pulse 93  Temp 98.4 F (36.9 C)  Resp 16  Ht 5\' 1"  (1.549 m)  Wt 195 lb (88.451 kg)  BMI 36.84 kg/m2  SpO2 100%  Physical Exam  Nursing note and vitals reviewed. Constitutional: She is oriented to person, place, and time. She appears well-developed.  HENT:  Head: Atraumatic.  Mouth/Throat: Oropharynx is clear and moist.  Eyes: Conjunctivae and EOM are normal. Pupils are equal, round, and reactive to light.  Neck: Normal range of motion. Neck supple.  Cardiovascular: Normal rate, regular rhythm, normal heart sounds and intact distal pulses.   Pulmonary/Chest: Effort normal and breath sounds normal. No respiratory distress. She has no wheezes. She has no rales.  Abdominal: Soft. She exhibits no distension. There is no tenderness. There is no rebound and no guarding.  Genitourinary:       Brown stool  Musculoskeletal: Normal range of motion.       Strength 5/5 throughout  Neurological: She is alert and oriented to person, place, and time.  Skin: Skin is warm and dry. No rash noted.  Psychiatric: She has a normal mood and affect.    Date: 04/08/2011  Rate: 85  Rhythm: normal sinus rhythm  QRS Axis: normal  Intervals: normal  ST/T Wave abnormalities: nonspecific T wave changes new inv t waves V2, avL compared to prior ecg   Conduction Disutrbances: first degree av block  Narrative Interpretation: possible lt atrial enlargement, borderline ecg  Old EKG Reviewed: changes noted  atrial flutter with variable block no longer present   ED Course  Procedures (including critical care time)  Labs Reviewed  CBC - Abnormal; Notable for the following:    RBC 1.96 (*)    Hemoglobin 5.2 (*)  HCT 17.7 (*)    MCHC 29.4 (*)    RDW 16.1 (*)    Platelets 716 (*)    All other components within normal limits  DIFFERENTIAL - Abnormal; Notable for the following:    Monocytes Absolute 1.1 (*)    All other components within normal limits  BASIC METABOLIC PANEL - Abnormal; Notable for the following:    Sodium 134 (*)    Glucose, Bld 114 (*)    Creatinine, Ser 1.50 (*)    GFR calc non Af Amer 40 (*)    GFR calc Af Amer 46 (*)    All other components within normal limits  PROTIME-INR - Abnormal; Notable for the following:    Prothrombin Time 25.6 (*)    INR 2.29 (*)    All other components within normal limits  OCCULT BLOOD X 1 CARD TO LAB, STOOL  TROPONIN I  OCCULT BLOOD X 1 CARD TO LAB, STOOL   No results found.   1. Anemia   2. Weakness generalized   3. Acute kidney injury     MDM  She is here with symptomatic anemia. Her hemoglobin is 5.2. Presumably the source is from her GI tract with her recent positive Hemoccult although her Hemoccult was negative here and she is brown stool. She has no abdominal pain at this time. Her INR is 2.29. There is no other potential identified source of bleeding at this time. Will admit patient to the hospitalist for transfusion and further workup and evaluation.  Discussed admission with Dr. Toniann Fail who accepted patient for transfer. Stepdown given that her Hgb is quite low in setting of coumadin use although the patient does appear well here.       Forbes Cellar, MD 04/08/11 2218  Forbes Cellar, MD 04/08/11 2252   11:27 PM  Patient now c/o min R lateral popliteal  Pain since arrival in the ED. Remains ambulatory, neurovasc intact. Min ttp. No swelling or ecchymosis. Tylenol ordered.  Forbes Cellar, MD 04/08/11  2328

## 2011-04-08 NOTE — Assessment & Plan Note (Signed)
Management per primary care. 

## 2011-04-08 NOTE — Progress Notes (Signed)
HPI: Pleasant female for f/u of MS/MR and MVR. On July 17, 2010 the patient had right miniature thoracotomy for mitral valve replacement (29-mm [employer] mechanical prosthesis). Note preoperative cardiac catheterization showed no coronary disease. Patient readmitted May 2012 with fever. Blood cultures were negative. There was a small fluid collection in the anterior chest that was aspirated which was negative. Echocardiogram in May of 2012 showed normal LV function, biatrial enlargement, prosthetic mitral valve, moderate tricuspid regurgitation and mild aortic insufficiency. 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 The patient did have recurrent fevers but workup negative. This included an evaluation by Dr. Maurice March. Followup chest CT in May of 2012 showed no change in her fluid collection. Fu chest CT in Oct 2012 showed fluid collection had decreased in size. I last saw her in Oct of 2012; since then, over the last 1-1/2 weeks she has noticed increased dyspnea on exertion, orthopnea, PND, pedal edema and weight gain. No chest pain, fevers, chills or hemoptysis. Mild hematochezia.   Current Outpatient Prescriptions  Medication Sig Dispense Refill  . acetaminophen (TYLENOL) 500 MG tablet Take 500 mg by mouth every 6 (six) hours as needed.        Marland Kitchen amLODipine (NORVASC) 5 MG tablet Take 1 tablet (5 mg total) by mouth daily.  30 tablet  11  . aspirin 81 MG EC tablet Take 81 mg by mouth daily.        Marland Kitchen buPROPion (WELLBUTRIN SR) 150 MG 12 hr tablet Take 1 tablet (150 mg total) by mouth 2 (two) times daily.  60 tablet  1  . cyclobenzaprine (FLEXERIL) 10 MG tablet TAKE ONE TABLET BY MOUTH EVERY 8 HOURS AS NEEDED FOR MUSCLE SPASMS  60 tablet  0  . estradiol (ESTRACE) 1 MG tablet One tablet by mouth daily for 21 days, followed by 7 days off- then repeat.  30 tablet  3  . fluticasone (FLONASE) 50 MCG/ACT nasal spray USE ONE SPRAY EACH NOSTRIL EVERY DAY  16 g  2  . furosemide (LASIX) 40 MG tablet Take 1 tablet (40 mg total)  by mouth daily.  30 tablet  12  . gabapentin (NEURONTIN) 300 MG capsule Take 1 capsule (300 mg total) by mouth 3 (three) times daily.  90 capsule  2  . lidocaine (XYLOCAINE) 2 % solution 2 teaspoons swish and spit 3 times daily as needed for mouth pain.  200 mL  0  . loratadine (CLARITIN) 10 MG tablet Take 10 mg by mouth daily.        . metoprolol (LOPRESSOR) 100 MG tablet Take 1 tablet (100 mg total) by mouth 2 (two) times daily.  60 tablet  11  . omeprazole (PRILOSEC) 20 MG capsule Take 1 capsule (20 mg total) by mouth daily.  30 capsule  5  . potassium chloride (K-DUR) 10 MEQ tablet Take 20 mEq by mouth daily.       Marland Kitchen venlafaxine (EFFEXOR-XR) 75 MG 24 hr capsule Take 1 capsule (75 mg total) by mouth daily.  30 capsule  2  . warfarin (COUMADIN) 5 MG tablet Take as directed by Anticoagulation clinic.  Pt takes up to 1 1/2 tablets daily.  45 tablet  2  . zolpidem (AMBIEN) 5 MG tablet Take 1 tablet (5 mg total) by mouth at bedtime as needed for sleep.  30 tablet  0     Past Medical History  Diagnosis Date  . Hypertension   . Hyperlipidemia   . GERD (gastroesophageal reflux disease)   . Depression   .  Asthma   . Eczema   . History of rheumatic fever   . Right shoulder injury     history of  . Mitral stenosis with regurgitation   . Syphilis prior 2006    Pt was treated for + RPR/titer  . Herpes     Past Surgical History  Procedure Date  . Cesarean section 1991  . Abdominal hysterectomy 01/2004    partial  . Breast surgery 05/2007    left breast biposy  . Femur fracture surgery 1983    right  . Mitral valve replacement 07/16/2010    #68mm Carbomed[employer] mechanical prosthesis via right mini thoracotomy    History   Social History  . Marital Status: Single    Spouse Name: N/A    Number of Children: 3  . Years of Education: N/A   Occupational History  . SALES    Social History Main Topics  . Smoking status: Former Games developer  . Smokeless tobacco: Not on file   Comment:  briefly as teenager  . Alcohol Use: No  . Drug Use: No  . Sexually Active: Not on file   Other Topics Concern  . Not on file   Social History Narrative   Regular exercise: yes    ROS: no fevers or chills, productive cough, hemoptysis, dysphasia, odynophagia, melena, hematochezia, dysuria, hematuria, rash, seizure activity, claudication. Remaining systems are negative.  Physical Exam: Well-developed well-nourished in no acute distress.  Skin is warm and dry.  HEENT is significant for pallor Neck is supple. No thyromegaly.  Chest is clear to auscultation with normal expansion.  Cardiovascular exam is regular rate and rhythm. Crisp mechanical valve sounds. 2/6 systolic murmur Abdominal exam nontender or distended. No masses palpated. Extremities show no edema. Palor in nail beds neuro grossly intact  ECG sinus rhythm at a rate of 86. No significant ST changes.

## 2011-04-08 NOTE — Telephone Encounter (Signed)
Reviewed lab work, hemoglobin is 5.9. Called patient, reviewed lab result, and instructed her to go directly to the ED. She is agreeable and will go now.

## 2011-04-08 NOTE — ED Notes (Signed)
Pt was seen by PMD today for blood in stool, had lab work done and was called to return here d/t hemoglobin of 5.

## 2011-04-09 ENCOUNTER — Encounter (HOSPITAL_COMMUNITY): Payer: Self-pay | Admitting: Pulmonary Disease

## 2011-04-09 ENCOUNTER — Telehealth: Payer: Self-pay | Admitting: *Deleted

## 2011-04-09 DIAGNOSIS — K625 Hemorrhage of anus and rectum: Secondary | ICD-10-CM

## 2011-04-09 DIAGNOSIS — Z7901 Long term (current) use of anticoagulants: Secondary | ICD-10-CM

## 2011-04-09 DIAGNOSIS — D649 Anemia, unspecified: Secondary | ICD-10-CM

## 2011-04-09 DIAGNOSIS — R531 Weakness: Secondary | ICD-10-CM

## 2011-04-09 DIAGNOSIS — K5900 Constipation, unspecified: Secondary | ICD-10-CM

## 2011-04-09 LAB — TYPE AND SCREEN

## 2011-04-09 LAB — CBC
HCT: 25.4 % — ABNORMAL LOW (ref 36.0–46.0)
Hemoglobin: 7.9 g/dL — ABNORMAL LOW (ref 12.0–15.0)
MCHC: 31.1 g/dL (ref 30.0–36.0)
MCV: 88.5 fL (ref 78.0–100.0)

## 2011-04-09 LAB — BASIC METABOLIC PANEL WITH GFR
Calcium: 9.5 mg/dL (ref 8.4–10.5)
GFR, Est African American: 49 mL/min — ABNORMAL LOW
GFR, Est Non African American: 43 mL/min — ABNORMAL LOW
Sodium: 137 mEq/L (ref 135–145)

## 2011-04-09 LAB — HEPARIN LEVEL (UNFRACTIONATED): Heparin Unfractionated: 0.18 IU/mL — ABNORMAL LOW (ref 0.30–0.70)

## 2011-04-09 LAB — COMPREHENSIVE METABOLIC PANEL
Alkaline Phosphatase: 77 U/L (ref 39–117)
BUN: 23 mg/dL (ref 6–23)
Chloride: 100 mEq/L (ref 96–112)
GFR calc Af Amer: 76 mL/min — ABNORMAL LOW (ref >90)
Glucose, Bld: 93 mg/dL (ref 70–99)
Potassium: 4.2 mEq/L (ref 3.5–5.1)
Total Bilirubin: 0.3 mg/dL (ref 0.3–1.2)
Total Protein: 7.6 g/dL (ref 6.0–8.3)

## 2011-04-09 LAB — BRAIN NATRIURETIC PEPTIDE: Brain Natriuretic Peptide: 70.8 pg/mL (ref 0.0–100.0)

## 2011-04-09 LAB — MRSA PCR SCREENING: MRSA by PCR: NEGATIVE

## 2011-04-09 LAB — PREPARE RBC (CROSSMATCH)

## 2011-04-09 MED ORDER — AMLODIPINE BESYLATE 5 MG PO TABS
5.0000 mg | ORAL_TABLET | Freq: Every day | ORAL | Status: DC
  Administered 2011-04-10 – 2011-04-11 (×2): 5 mg via ORAL
  Filled 2011-04-09 (×2): qty 1

## 2011-04-09 MED ORDER — FUROSEMIDE 40 MG PO TABS
60.0000 mg | ORAL_TABLET | Freq: Two times a day (BID) | ORAL | Status: DC
  Administered 2011-04-09: 60 mg via ORAL
  Filled 2011-04-09 (×3): qty 1

## 2011-04-09 MED ORDER — BUPROPION HCL ER (SR) 150 MG PO TB12
150.0000 mg | ORAL_TABLET | Freq: Two times a day (BID) | ORAL | Status: DC
  Administered 2011-04-09 – 2011-04-11 (×4): 150 mg via ORAL
  Filled 2011-04-09 (×6): qty 1

## 2011-04-09 MED ORDER — VITAMIN K1 10 MG/ML IJ SOLN
2.0000 mg | Freq: Once | INTRAMUSCULAR | Status: AC
  Administered 2011-04-09: 2 mg via SUBCUTANEOUS
  Filled 2011-04-09: qty 0.2

## 2011-04-09 MED ORDER — ZOLPIDEM TARTRATE 5 MG PO TABS
5.0000 mg | ORAL_TABLET | Freq: Every day | ORAL | Status: DC
  Administered 2011-04-09 – 2011-04-10 (×2): 5 mg via ORAL
  Filled 2011-04-09 (×2): qty 1

## 2011-04-09 MED ORDER — PEG-KCL-NACL-NASULF-NA ASC-C 100 G PO SOLR
1.0000 | Freq: Once | ORAL | Status: AC
  Administered 2011-04-09: 100 g via ORAL
  Filled 2011-04-09: qty 1

## 2011-04-09 MED ORDER — METOPROLOL TARTRATE 100 MG PO TABS
100.0000 mg | ORAL_TABLET | Freq: Two times a day (BID) | ORAL | Status: DC
  Administered 2011-04-10 – 2011-04-11 (×2): 100 mg via ORAL
  Filled 2011-04-09 (×5): qty 1

## 2011-04-09 MED ORDER — METOPROLOL TARTRATE 100 MG PO TABS
100.0000 mg | ORAL_TABLET | Freq: Two times a day (BID) | ORAL | Status: DC
  Administered 2011-04-09: 100 mg via ORAL
  Filled 2011-04-09 (×3): qty 1

## 2011-04-09 MED ORDER — CYCLOBENZAPRINE HCL 10 MG PO TABS: 5.0000 mg | ORAL_TABLET | Freq: Three times a day (TID) | ORAL | Status: DC | PRN

## 2011-04-09 MED ORDER — LORATADINE 10 MG PO TABS
10.0000 mg | ORAL_TABLET | Freq: Every day | ORAL | Status: DC | PRN
  Filled 2011-04-09: qty 1

## 2011-04-09 MED ORDER — AMLODIPINE BESYLATE 5 MG PO TABS
5.0000 mg | ORAL_TABLET | Freq: Every day | ORAL | Status: DC
  Administered 2011-04-09: 5 mg via ORAL
  Filled 2011-04-09: qty 1

## 2011-04-09 MED ORDER — GABAPENTIN 300 MG PO CAPS
300.0000 mg | ORAL_CAPSULE | Freq: Three times a day (TID) | ORAL | Status: DC
  Administered 2011-04-09 – 2011-04-11 (×5): 300 mg via ORAL
  Filled 2011-04-09 (×9): qty 1

## 2011-04-09 MED ORDER — POTASSIUM CHLORIDE ER 10 MEQ PO TBCR
10.0000 meq | EXTENDED_RELEASE_TABLET | Freq: Two times a day (BID) | ORAL | Status: DC
  Administered 2011-04-09: 10 meq via ORAL
  Filled 2011-04-09 (×2): qty 1

## 2011-04-09 MED ORDER — VENLAFAXINE HCL ER 75 MG PO CP24
75.0000 mg | ORAL_CAPSULE | Freq: Every day | ORAL | Status: DC
  Administered 2011-04-09 – 2011-04-11 (×2): 75 mg via ORAL
  Filled 2011-04-09 (×3): qty 1

## 2011-04-09 MED ORDER — ONDANSETRON HCL 4 MG/2ML IJ SOLN: 4.0000 mg | Freq: Four times a day (QID) | INTRAMUSCULAR | Status: DC | PRN

## 2011-04-09 MED ORDER — PANTOPRAZOLE SODIUM 40 MG PO TBEC
40.0000 mg | DELAYED_RELEASE_TABLET | Freq: Every day | ORAL | Status: DC
  Administered 2011-04-09 – 2011-04-10 (×2): 40 mg via ORAL
  Filled 2011-04-09 (×2): qty 1

## 2011-04-09 MED ORDER — FLUTICASONE PROPIONATE 50 MCG/ACT NA SUSP
1.0000 | Freq: Every day | NASAL | Status: DC
  Filled 2011-04-09: qty 16

## 2011-04-09 MED ORDER — PANTOPRAZOLE SODIUM 40 MG IV SOLR
40.0000 mg | Freq: Two times a day (BID) | INTRAVENOUS | Status: DC
  Administered 2011-04-09: 40 mg via INTRAVENOUS
  Filled 2011-04-09 (×3): qty 40

## 2011-04-09 MED ORDER — HEPARIN SOD (PORCINE) IN D5W 100 UNIT/ML IV SOLN
1150.0000 [IU]/h | INTRAVENOUS | Status: DC
  Administered 2011-04-09: 900 [IU]/h via INTRAVENOUS
  Administered 2011-04-10: 1150 [IU]/h via INTRAVENOUS
  Filled 2011-04-09 (×6): qty 250

## 2011-04-09 MED ORDER — ACETAMINOPHEN 325 MG PO TABS
650.0000 mg | ORAL_TABLET | Freq: Four times a day (QID) | ORAL | Status: DC | PRN
  Administered 2011-04-09 – 2011-04-11 (×2): 650 mg via ORAL
  Filled 2011-04-09 (×3): qty 2

## 2011-04-09 MED ORDER — ONDANSETRON HCL 4 MG PO TABS: 4.0000 mg | ORAL_TABLET | Freq: Four times a day (QID) | ORAL | Status: DC | PRN

## 2011-04-09 MED ORDER — SODIUM CHLORIDE 0.9 % IV SOLN: INTRAVENOUS | Status: DC

## 2011-04-09 MED ORDER — ACETAMINOPHEN 650 MG RE SUPP: 650.0000 mg | Freq: Four times a day (QID) | RECTAL | Status: DC | PRN

## 2011-04-09 NOTE — Progress Notes (Signed)
ANTICOAGULATION CONSULT NOTE - Follow Up Consult  Pharmacy Consult for heparin + coumadin Indication: MVR  No Known Allergies  Patient Measurements: Height: 5\' 1"  (154.9 cm) Weight: 197 lb 15.6 oz (89.8 kg) IBW/kg (Calculated) : 47.8   Vital Signs: Temp: 97.6 F (36.4 C) (01/10 2346) Temp src: Oral (01/10 2346) BP: 114/67 mmHg (01/10 2346) Pulse Rate: 83  (01/10 2346)  Labs:  Basename 04/09/11 2142 04/09/11 1045 04/08/11 2133 04/08/11 2106 04/08/11 1246 04/08/11 1208  HGB -- 7.9* -- 5.2* -- --  HCT -- 25.4* -- 17.7* -- 20.1*  PLT -- 609* -- 716* -- 812*  APTT -- -- -- -- -- --  LABPROT -- -- -- 25.6* -- --  INR -- -- -- 2.29* -- --  HEPARINUNFRC 0.18* -- -- -- -- --  CREATININE -- 0.99 -- 1.50* 1.43* --  CKTOTAL -- -- -- -- -- --  CKMB -- -- -- -- -- --  TROPONINI -- -- <0.30 -- -- --   Estimated Creatinine Clearance: 69.3 ml/min (by C-G formula based on Cr of 0.99).   Medications:  Prescriptions prior to admission  Medication Sig Dispense Refill  . acetaminophen (TYLENOL) 500 MG tablet Take 500 mg by mouth every 6 (six) hours as needed.        Marland Kitchen amLODipine (NORVASC) 5 MG tablet Take 1 tablet (5 mg total) by mouth daily.  30 tablet  11  . aspirin 81 MG EC tablet Take 81 mg by mouth daily.        Marland Kitchen buPROPion (WELLBUTRIN SR) 150 MG 12 hr tablet Take 1 tablet (150 mg total) by mouth 2 (two) times daily.  60 tablet  1  . cyclobenzaprine (FLEXERIL) 10 MG tablet TAKE ONE TABLET BY MOUTH EVERY 8 HOURS AS NEEDED FOR MUSCLE SPASMS  60 tablet  0  . estradiol (ESTRACE) 1 MG tablet One tablet by mouth daily for 21 days, followed by 7 days off- then repeat.  30 tablet  3  . fluticasone (FLONASE) 50 MCG/ACT nasal spray USE ONE SPRAY EACH NOSTRIL EVERY DAY  16 g  2  . furosemide (LASIX) 40 MG tablet Take 60 mg by mouth 2 (two) times daily.      Marland Kitchen gabapentin (NEURONTIN) 300 MG capsule Take 1 capsule (300 mg total) by mouth 3 (three) times daily.  90 capsule  2  . lidocaine  (XYLOCAINE) 2 % solution 2 teaspoons swish and spit 3 times daily as needed for mouth pain.  200 mL  0  . loratadine (CLARITIN) 10 MG tablet Take 10 mg by mouth daily as needed. For seasonal allergies      . metoprolol (LOPRESSOR) 100 MG tablet Take 1 tablet (100 mg total) by mouth 2 (two) times daily.  60 tablet  11  . omeprazole (PRILOSEC) 20 MG capsule Take 1 capsule (20 mg total) by mouth daily.  30 capsule  5  . potassium chloride (K-DUR) 10 MEQ tablet Take 10 mEq by mouth 2 (two) times daily.       Marland Kitchen venlafaxine (EFFEXOR-XR) 75 MG 24 hr capsule Take 1 capsule (75 mg total) by mouth daily.  30 capsule  2  . warfarin (COUMADIN) 5 MG tablet Take 5-7.5 mg by mouth daily. Take 1 tab on Sunday and Thursday. Take 1 & 1/2 tabs on Monday, Tuesday, Wednesday, Friday and Saturday      . zolpidem (AMBIEN) 5 MG tablet Take 5 mg by mouth at bedtime.  Assessment: 51 yo female with h/o MVR, Coumadin on hold, for Heparin.  Goal of Therapy:  Heparin level 0.3-0.7   Plan:  Increase Heparin 1150 units/hr Follow-up am labs.  Sarabella Caprio, Gary Fleet 04/09/2011,11:55 PM

## 2011-04-09 NOTE — Progress Notes (Signed)
ANTICOAGULATION CONSULT NOTE - Initial Consult  Pharmacy Consult for Coumadin Indication: MVR  No Known Allergies  Patient Measurements: Height: 5\' 1"  (154.9 cm) Weight: 197 lb 15.6 oz (89.8 kg) IBW/kg (Calculated) : 47.8   Vital Signs: Temp: 97.5 F (36.4 C) (01/10 0030) Temp src: Oral (01/10 0030) BP: 116/62 mmHg (01/10 0030) Pulse Rate: 83  (01/10 0030)  Labs:  Basename 04/08/11 2133 04/08/11 2106 04/08/11 1246 04/08/11 1208  HGB -- 5.2* -- 5.9*  HCT -- 17.7* -- 20.1*  PLT -- 716* -- 812*  APTT -- -- -- --  LABPROT -- 25.6* -- --  INR -- 2.29* -- --  HEPARINUNFRC -- -- -- --  CREATININE -- 1.50* 1.43* --  CKTOTAL -- -- -- --  CKMB -- -- -- --  TROPONINI <0.30 -- -- --   Estimated Creatinine Clearance: 45.8 ml/min (by C-G formula based on Cr of 1.5).  Medical History: Past Medical History  Diagnosis Date  . Hypertension   . Hyperlipidemia   . GERD (gastroesophageal reflux disease)   . Depression   . Asthma   . Eczema   . History of rheumatic fever   . Right shoulder injury     history of  . Mitral stenosis with regurgitation   . Syphilis prior 2006    Pt was treated for + RPR/titer  . Herpes     Medications:  Prescriptions prior to admission  Medication Sig Dispense Refill  . acetaminophen (TYLENOL) 500 MG tablet Take 500 mg by mouth every 6 (six) hours as needed.        Marland Kitchen amLODipine (NORVASC) 5 MG tablet Take 1 tablet (5 mg total) by mouth daily.  30 tablet  11  . aspirin 81 MG EC tablet Take 81 mg by mouth daily.        Marland Kitchen buPROPion (WELLBUTRIN SR) 150 MG 12 hr tablet Take 1 tablet (150 mg total) by mouth 2 (two) times daily.  60 tablet  1  . cyclobenzaprine (FLEXERIL) 10 MG tablet TAKE ONE TABLET BY MOUTH EVERY 8 HOURS AS NEEDED FOR MUSCLE SPASMS  60 tablet  0  . estradiol (ESTRACE) 1 MG tablet One tablet by mouth daily for 21 days, followed by 7 days off- then repeat.  30 tablet  3  . fluticasone (FLONASE) 50 MCG/ACT nasal spray USE ONE SPRAY EACH  NOSTRIL EVERY DAY  16 g  2  . furosemide (LASIX) 40 MG tablet Take 60 mg by mouth 2 (two) times daily.      Marland Kitchen gabapentin (NEURONTIN) 300 MG capsule Take 1 capsule (300 mg total) by mouth 3 (three) times daily.  90 capsule  2  . lidocaine (XYLOCAINE) 2 % solution 2 teaspoons swish and spit 3 times daily as needed for mouth pain.  200 mL  0  . loratadine (CLARITIN) 10 MG tablet Take 10 mg by mouth daily as needed. For seasonal allergies      . metoprolol (LOPRESSOR) 100 MG tablet Take 1 tablet (100 mg total) by mouth 2 (two) times daily.  60 tablet  11  . omeprazole (PRILOSEC) 20 MG capsule Take 1 capsule (20 mg total) by mouth daily.  30 capsule  5  . potassium chloride (K-DUR) 10 MEQ tablet Take 10 mEq by mouth 2 (two) times daily.       Marland Kitchen venlafaxine (EFFEXOR-XR) 75 MG 24 hr capsule Take 1 capsule (75 mg total) by mouth daily.  30 capsule  2  . warfarin (COUMADIN) 5 MG  tablet Take 5-7.5 mg by mouth daily. Take 1 tab on Sunday and Thursday. Take 1 & 1/2 tabs on Monday, Tuesday, Wednesday, Friday and Saturday      . zolpidem (AMBIEN) 5 MG tablet Take 5 mg by mouth at bedtime.       Scheduled:    . acetaminophen      . acetaminophen  650 mg Oral Once  . amLODipine  5 mg Oral Daily  . buPROPion  150 mg Oral BID  . fluticasone  1 spray Each Nare Daily  . furosemide  60 mg Oral BID  . gabapentin  300 mg Oral TID  . metoprolol  100 mg Oral BID  . pantoprazole (PROTONIX) IV  40 mg Intravenous Q12H  . potassium chloride  10 mEq Oral BID  . venlafaxine  75 mg Oral Daily  . zolpidem  5 mg Oral QHS  . DISCONTD: sodium chloride   Intravenous STAT    Assessment: 51yo female admitted with possible GIB, c/o weakness and dark stools, found at PCP to have Hgb of 5.9 and sent to ED, INR subtherapeutic given higher goal for MVR; Coumadin ordered to continue for now.  Goal of Therapy:  INR 2.5-3.5   Plan:  Will consider resuming home dose of 5mg  Sun/Thu and 7.5mg  rest of week but will hold off on  ordering until seen during the day in case change of therapy plan (took dose at home on 1/9).  Colleen Can PharmD BCPS 04/09/2011,1:47 AM

## 2011-04-09 NOTE — Progress Notes (Signed)
I have examined the patient and reviewed the chart. She has received 2 Units of PRBC and states she feels stronger. Hgb up to 7.9 She has had no further GI bleed today. She has no history of CAD.   Plan: Small dose of Vit K (2.5U) today. F/u INR in AM.  Start Heparin drip as INR is now < 2.5.  ARF improving. She has received one dose of Lasix today and is due to receive another one at 6 PM. I will d/c this. Will d/c second dose of KCL as well.  BP low- will add holding parameters on Norvasc and Metoprolol.  Appreciate GI follow up. Plan to do upper and lower endoscopies once INR < 2.0. PPI has been switched to oral and is being continues empirically for now.   Calvert Cantor MD [phone]

## 2011-04-09 NOTE — Progress Notes (Signed)
ANTICOAGULATION CONSULT NOTE - Follow Up Consult  Pharmacy Consult for heparin + coumadin Indication: MVR  No Known Allergies  Patient Measurements: Height: 5\' 1"  (154.9 cm) Weight: 197 lb 15.6 oz (89.8 kg) IBW/kg (Calculated) : 47.8   Vital Signs: Temp: 97.8 F (36.6 C) (01/10 1200) Temp src: Oral (01/10 1200) BP: 116/64 mmHg (01/10 1200) Pulse Rate: 77  (01/10 1200)  Labs:  Basename 04/09/11 1045 04/08/11 2133 04/08/11 2106 04/08/11 1246 04/08/11 1208  HGB 7.9* -- 5.2* -- --  HCT 25.4* -- 17.7* -- 20.1*  PLT 609* -- 716* -- 812*  APTT -- -- -- -- --  LABPROT -- -- 25.6* -- --  INR -- -- 2.29* -- --  HEPARINUNFRC -- -- -- -- --  CREATININE 0.99 -- 1.Julia* 1.43* --  CKTOTAL -- -- -- -- --  CKMB -- -- -- -- --  TROPONINI -- <0.30 -- -- --   Estimated Creatinine Clearance: 69.3 ml/min (by C-G formula based on Cr of 0.99).   Medications:  Prescriptions prior to admission  Medication Sig Dispense Refill  . acetaminophen (TYLENOL) 500 MG tablet Take 500 mg by mouth every 6 (six) hours as needed.        Marland Kitchen amLODipine (NORVASC) 5 MG tablet Take 1 tablet (5 mg total) by mouth daily.  30 tablet  11  . aspirin 81 MG EC tablet Take 81 mg by mouth daily.        Marland Kitchen buPROPion (WELLBUTRIN SR) 150 MG 12 hr tablet Take 1 tablet (150 mg total) by mouth 2 (two) times daily.  60 tablet  1  . cyclobenzaprine (FLEXERIL) 10 MG tablet TAKE ONE TABLET BY MOUTH EVERY 8 HOURS AS NEEDED FOR MUSCLE SPASMS  60 tablet  0  . estradiol (ESTRACE) 1 MG tablet One tablet by mouth daily for 21 days, followed by 7 days off- then repeat.  30 tablet  3  . fluticasone (FLONASE) Julia MCG/ACT nasal spray USE ONE SPRAY EACH NOSTRIL EVERY DAY  16 g  2  . furosemide (LASIX) 40 MG tablet Take 60 mg by mouth 2 (two) times daily.      Marland Kitchen gabapentin (NEURONTIN) 300 MG capsule Take 1 capsule (300 mg total) by mouth 3 (three) times daily.  90 capsule  2  . lidocaine (XYLOCAINE) 2 % solution 2 teaspoons swish and spit 3 times  daily as needed for mouth pain.  200 mL  0  . loratadine (CLARITIN) 10 MG tablet Take 10 mg by mouth daily as needed. For seasonal allergies      . metoprolol (LOPRESSOR) 100 MG tablet Take 1 tablet (100 mg total) by mouth 2 (two) times daily.  60 tablet  11  . omeprazole (PRILOSEC) 20 MG capsule Take 1 capsule (20 mg total) by mouth daily.  30 capsule  5  . potassium chloride (K-DUR) 10 MEQ tablet Take 10 mEq by mouth 2 (two) times daily.       Marland Kitchen venlafaxine (EFFEXOR-XR) 75 MG 24 hr capsule Take 1 capsule (75 mg total) by mouth daily.  30 capsule  2  . warfarin (COUMADIN) 5 MG tablet Take 5-7.5 mg by mouth daily. Take 1 tab on Sunday and Thursday. Take 1 & 1/2 tabs on Monday, Tuesday, Wednesday, Friday and Saturday      . zolpidem (AMBIEN) 5 MG tablet Take 5 mg by mouth at bedtime.        Assessment: Julia Garza on coumadin PTA for MVR. Admitted with some rectal bleeding  and now planning for colonoscopy. INR last night was 2.29. Pt with low H/H but plts elevated. INR is being reversed with vitamin K and next INR will be checked in the AM. If <2, then will proceed with colonoscopy + EGD. In the meantime, coumadin will be held and a heparin infusion will be used. Will not bolus d/t low hemoglobin/bleeding and unknown INR at time of heparin start.   Goal of Therapy:  Heparin level 0.3-0.7 units/ml INR goal 2.5-3.5   Plan:  1. Start heparin gtt at 900units/hr (no bolus) 2. Check a heparin level 8 hours after start of infusion 3. Daily heparin level, CBC and INR  Encarnacion Bole, Drake Leach 04/09/2011,1:25 PM

## 2011-04-09 NOTE — Consult Note (Signed)
[facility] Consult: 9:56 AM 04/09/2011   Referring Provider: Butler Denmark Primary Care Physician:  Lemont Fillers., NP, NP Primary Gastroenterologist:  None, new to GI  Reason for Consultation:  Normocytic anemia and 2 week hx minor rectal bleeding  HPI: Julia Garza is a 51 y.o. female.  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  2 weeks or so of minor rectal bleeding:  Mostly on wiping after stooling, last 48 hours slightly more vigorous bleeding but drips of blood a/w brown BM is the worst it has been.  No abd pain.  2 weeks of fatigue, malaise, some dizzyness, she thought she had a viral illness (had her flu shot already).  At MD office 1/9 Hgb was 5.9 c/w 7.9 to 8.8 in April & May of 2012.  She was admitted and Hgb was 5.2.  Now s/p 2 units PRBCs, just completed now.  No belly pain, wt is up 20 pounds over several months despite 1200 calorie diet and regular walks. No reflux sxs, no ASA/NSAIDs, no large hematomas or aggressive bleeding of any sort.  Takes Prilosec daily.  No dysphagia.  Positive for increased flatulence.  No altered bowel habits, size or color. On 03/02/11 seen in office for c/o oral sores:  Initially treated with Amoxicillin by urgent care which [facility] nurse practitioner promptly d/cd.  Viscous lidocaine was also used.   No prior transfusions or Iron therapy.  Last menses in 2005.  INR max was 3.6 in October. She had B lower extremity edema a few weeks back, which responded to Lasix.    2 paternal uncles and 2 distant maternal cousins have had colon cancer.  Mom has hx of colon adenomas and regular colonoscopies as well as anemia.  No fam hx of sickle cell, thalessemia, etc  Past Medical History  Diagnosis Date  . Hypertension   . Hyperlipidemia   . GERD (gastroesophageal reflux disease)   . Depression   . Asthma   . Eczema   . History of rheumatic fever   . Right shoulder injury     history of    . Mitral stenosis with regurgitation   . Syphilis prior 2006    Pt was treated for + RPR/titer  . Herpes     Past Surgical History  Procedure Date  . Cesarean section 1991  . Abdominal hysterectomy 01/2004    partial  . Breast surgery 05/2007    left breast biposy  . Femur fracture surgery 1983    right  . Mitral valve replacement 07/16/2010    #65mm Carbomed[employer] mechanical prosthesis via right mini thoracotomy    Prior to Admission medications   Medication Sig Start Date End Date Taking? Authorizing Provider  acetaminophen (TYLENOL) 500 MG tablet Take 500 mg by mouth every 6 (six) hours as needed.     Yes Historical Provider, MD  amLODipine (NORVASC) 5 MG tablet Take 1 tablet (5 mg total) by mouth daily. 12/31/10 12/31/11 Yes Lewayne Bunting, MD  aspirin 81 MG EC tablet Take 81 mg by mouth daily.     Yes Historical Provider, MD  buPROPion (WELLBUTRIN SR) 150 MG 12 hr tablet Take 1 tablet (150 mg total) by mouth 2 (two) times daily. 04/08/11 04/07/12 Yes Melissa S. Peggyann Juba, NP  cyclobenzaprine (FLEXERIL) 10 MG tablet TAKE ONE TABLET BY MOUTH EVERY 8 HOURS AS NEEDED FOR MUSCLE SPASMS 03/31/11  Yes Norton Blizzard, MD  estradiol (ESTRACE) 1 MG tablet One tablet by mouth daily for 21 days,  followed by 7 days off- then repeat. 01/16/11  Yes Melissa S. Peggyann Juba, NP  fluticasone (FLONASE) 50 MCG/ACT nasal spray USE ONE SPRAY EACH NOSTRIL EVERY DAY 04/03/11  Yes Melissa S. Peggyann Juba, NP  furosemide (LASIX) 40 MG tablet Take 60 mg by mouth 2 (two) times daily. 04/08/11  Yes Lewayne Bunting, MD  gabapentin (NEURONTIN) 300 MG capsule Take 1 capsule (300 mg total) by mouth 3 (three) times daily. 01/07/11 01/07/12 Yes Purcell Nails, MD  lidocaine (XYLOCAINE) 2 % solution 2 teaspoons swish and spit 3 times daily as needed for mouth pain. 03/16/11  Yes Melissa S. Peggyann Juba, NP  loratadine (CLARITIN) 10 MG tablet Take 10 mg by mouth daily as needed. For seasonal allergies   Yes Historical Provider, MD   metoprolol (LOPRESSOR) 100 MG tablet Take 1 tablet (100 mg total) by mouth 2 (two) times daily. 08/26/10 08/26/11 Yes Lewayne Bunting, MD  omeprazole (PRILOSEC) 20 MG capsule Take 1 capsule (20 mg total) by mouth daily. 11/24/10 11/24/11 Yes Melissa S. Peggyann Juba, NP  potassium chloride (K-DUR) 10 MEQ tablet Take 10 mEq by mouth 2 (two) times daily.    Yes Historical Provider, MD  venlafaxine (EFFEXOR-XR) 75 MG 24 hr capsule Take 1 capsule (75 mg total) by mouth daily. 04/08/11 04/07/12 Yes Melissa S. Peggyann Juba, NP  warfarin (COUMADIN) 5 MG tablet Take 5-7.5 mg by mouth daily. Take 1 tab on Sunday and Thursday. Take 1 & 1/2 tabs on Monday, Tuesday, Wednesday, Friday and Saturday 01/28/11  Yes Lewayne Bunting, MD  zolpidem (AMBIEN) 5 MG tablet Take 5 mg by mouth at bedtime. 04/06/11  Yes Melissa S. Peggyann Juba, NP    Scheduled Meds:    . acetaminophen      . acetaminophen  650 mg Oral Once  . amLODipine  5 mg Oral Daily  . buPROPion  150 mg Oral BID  . fluticasone  1 spray Each Nare Daily  . furosemide  60 mg Oral BID  . gabapentin  300 mg Oral TID  . metoprolol  100 mg Oral BID  . pantoprazole (PROTONIX) IV  40 mg Intravenous Q12H  . potassium chloride  10 mEq Oral BID  . venlafaxine  75 mg Oral Daily  . zolpidem  5 mg Oral QHS  . DISCONTD: sodium chloride   Intravenous STAT   Infusions:    . sodium chloride    . DISCONTD: sodium chloride 125 mL/hr at 04/08/11 2250   PRN Meds: acetaminophen, acetaminophen, cyclobenzaprine, loratadine, ondansetron (ZOFRAN) IV, ondansetron   Allergies as of 04/08/2011  . (No Known Allergies)    Family History  Problem Relation Age of Onset  . Heart disease Mother   . Kidney disease Mother   . Diabetes Mother   . Thyroid disease Mother   . Colon polyps Mother   . Heart attack Mother   . Hypertension Mother   . Heart disease Father   . Heart attack Father     History   Social History  . Marital Status: Single    Spouse Name: N/A     Number of Children: 3  . Years of Education: N/A   Occupational History  . SALES    Social History Main Topics  . Smoking status: Former Games developer  . Smokeless tobacco: Not on file   Comment: briefly as teenager  . Alcohol Use: No  . Drug Use: No  . Sexually Active: No   Other Topics Concern  . Not on file   Social  History Narrative   Regular exercise: yes    REVIEW OF SYSTEMS: All positive and pertinent negatives noted above.  Full 12 system review completed   PHYSICAL EXAM: Vital signs in last 24 hours: Temp:  [97.5 F (36.4 C)-98.6 F (37 C)] 98.6 F (37 C) (01/10 0935) Pulse Rate:  [81-96] 82  (01/10 0800) Resp:  [14-21] 17  (01/10 0800) BP: (99-134)/(52-97) 109/52 mmHg (01/10 0935) SpO2:  [100 %] 100 % (01/10 0800) FiO2 (%):  [28 %] 28 % (01/10 0125) Weight:  [88.451 kg (195 lb)-89.8 kg (197 lb 15.6 oz)] 89.8 kg (197 lb 15.6 oz) (01/10 0030)   Last BM Date: 04/08/11  General: Pleasant , looks well.  Intelligent and excellent historian. Head:  Atraumatic, normocephallic  Eyes:  No pallor in conjunctiva Ears:  Not HOH  Nose:  No discharge Mouth:  No lesions or blood Neck:  No thyromegaly Lungs:  Clear B Heart: RRR.  Valve click crisp.  Small incision scars at R lateral chest Abdomen:  Soft, NT, ND.  Active BS. No masses.   Rectal: no hemorrhoids, did not repeat digital exam, as it was positive previously   Musc/Skeltl: no joint swelling Extremities:  No edems  Neurologic:  Pleasant, not confused, no tremor. Skin:  No rash Tattoos:  None seen Nodes:  None in groin.   Psych:  Not depressed, not anxious.  Excellent historian.  Intake/Output from previous day: 01/09 0701 - 01/10 0700 In: 1065 [I.V.:665; Blood:400] Out: 1450 [Urine:1450] Intake/Output this shift: Total I/O In: 375 [Blood:375] Out: 0   LAB RESULTS:  Basename 04/08/11 2106 04/08/11 1208  WBC 9.6 9.2  HGB 5.2* 5.9*  HCT 17.7* 20.1*  PLT 716* 812*   BMET Lab Results  Component  Value Date   NA 134* 04/08/2011   NA 137 04/08/2011   NA 139 12/03/2010   K 4.9 04/08/2011   K 5.6* 04/08/2011   K 4.5 12/03/2010   CL 99 04/08/2011   CL 102 04/08/2011   CL 103 12/03/2010   CO2 24 04/08/2011   CO2 22 04/08/2011   CO2 28 12/03/2010   GLUCOSE 114* 04/08/2011   GLUCOSE 111* 04/08/2011   GLUCOSE 105* 12/03/2010   BUN 21 04/08/2011   BUN 21 04/08/2011   BUN 14 12/03/2010   CREATININE 1.50* 04/08/2011   CREATININE 1.43* 04/08/2011   CREATININE 0.94 12/03/2010   CALCIUM 9.2 04/08/2011   CALCIUM 9.5 04/08/2011   CALCIUM 10.4 12/03/2010   LFT Lab Results  Component Value Date   PROT 7.3 08/04/2010   PROT 7.8 07/10/2010   PROT 7.6 03/25/2010   ALBUMIN 2.6* 08/04/2010   ALBUMIN 4.2 07/10/2010   ALBUMIN 3.6 03/25/2010   AST 23 08/04/2010   AST 21 07/10/2010   AST 30 03/25/2010   ALT 26 08/04/2010   ALT 14 07/10/2010   ALT 16 03/25/2010   ALKPHOS 180* 08/04/2010   ALKPHOS 71 07/10/2010   ALKPHOS 76 03/25/2010   BILITOT 0.3 08/04/2010   BILITOT 0.5 07/10/2010   BILITOT 1.1 03/25/2010   BILIDIR 0.1 03/11/2010   IBILI 0.3 03/11/2010   PT/INR Lab Results  Component Value Date   INR 2.29* 04/08/2011   INR 2.6 04/03/2011   INR 3.4 01/28/2011    RADIOLOGY STUDIES: No results found.  ENDOSCOPIC STUDIES: None ever  IMPRESSION: 1.  Rectal bleeding, minor volumes which do not explain the severe anemia.  R/o neoplasia of colon, r/o hemorrhoids, r/o avm's, r/o "silent" PUD. 2.  Anemia, normocytic,  acute.  S/P 2 units PRBCs.  She has had anemia for several months. 3.  S/p mitral valve replacement 4.  Chronic Coumadin therapy.  Therapeutic INR.  Med on hold.  PLAN: 1.  Colonoscopy and EGD when INR reversed.  Will plan this for tomorrow afternoon 1/11, but only if INR is less than 2.0.  Protime/INR tomorrow AM. 2. Discontinue IV protonix, will continue empiric oral protonix but not even clear she needs this. D/c IVF   LOS: 1 day   Jennye Moccasin  04/09/2011, 9:56 AM Pager: [phone]  I have reviewed the above note,  examined the patient and agree with plan of treatment. Low volume hematochezia likely anorectal origin, there is,however a family hx of several indirect relatives with colon cancer and mother with adenomatous polyps.She gets constipated.Also chronic GERD issues partially controlled with PPI. She is hemodynamically stable, will plan for EGD/colon ina Heparin window tomorrow if INR =< 2.0

## 2011-04-09 NOTE — Telephone Encounter (Signed)
Pt was notified of result and advised to go to the hospital by [facility] on 04/08/11.  [facility] Triage Call Report Triage Record Num: [id number] Operator: Micah Flesher Patient Name: Julia Garza Call Date & Time: 04/08/2011 6:18:24PM Patient Phone: [phone] PCP: Duncan Dull Patient Gender: Female PCP Fax : [phone] Patient DOB: 1960-10-13 Practice Name: [facility] Reason for Call: Caller: Delaney Meigs; PCP: Other; CB#: [phone]; Call regarding Delaney Meigs is calling from [employer] regarding a CBC ordered on [place], [place] by Other.; Critical HGB 5.9. repeated and verified. RBC 2.2, HCT 20.1, MCHC 29.4, RDW 16.6, PLT 812, Absolute Mono 101. Last HGB 02/02/11 was 11.6. Dr. Darrick Huntsman notified and she instructed me to call the pt and have her go to the nearest ED. I left a message for her on [phone] to call back regarding her lab work. Protocol(s) Used: Office Note Recommended Outcome per Protocol: Information Noted and Sent to Office Reason for Outcome: Caller information to office

## 2011-04-09 NOTE — H&P (Signed)
Julia Garza is an 51 y.o. female.  PCP - Ms.Daryel Gerald NP [facility]. Cardiology - Dr.Crenshaw.  Chief Complaint: Weakness and dizziness. HPI: 51 year old female with history of mechanical mitral valve on Coumadin, history of hypertension and asthma had gone to her primary care physician yesterday and was complaining of weakness and dizziness in addition rectal bleed. Patient has been having these symptoms for over a week now. Hemoglobin done at the primary care's office partial removal of 5.9 and stool was positive for occult blood. Patient was referred to ER at [facility]. Repeat hemoglobin was 5.2. Patient has been admitted for further workup.  Past Medical History  Diagnosis Date  . Hypertension   . Hyperlipidemia   . GERD (gastroesophageal reflux disease)   . Depression   . Asthma   . Eczema   . History of rheumatic fever   . Right shoulder injury     history of  . Mitral stenosis with regurgitation   . Syphilis prior 2006    Pt was treated for + RPR/titer  . Herpes     Past Surgical History  Procedure Date  . Cesarean section 1991  . Abdominal hysterectomy 01/2004    partial  . Breast surgery 05/2007    left breast biposy  . Femur fracture surgery 1983    right  . Mitral valve replacement 07/16/2010    #53mm Carbomedics mechanical prosthesis via right mini thoracotomy    Family History  Problem Relation Age of Onset  . Heart disease Mother   . Kidney disease Mother   . Diabetes Mother   . Thyroid disease Mother   . Colon polyps Mother   . Heart attack Mother   . Hypertension Mother   . Heart disease Father   . Heart attack Father    Social History:  reports that she has quit smoking. She does not have any smokeless tobacco history on file. She reports that she does not drink alcohol or use illicit drugs.  Allergies: No Known Allergies  Medications Prior to Admission  Medication Dose Route Frequency Provider Last Rate Last Dose  .  0.9 %  sodium chloride infusion   Intravenous Continuous Eduard Clos      . acetaminophen (TYLENOL) 325 MG tablet           . acetaminophen (TYLENOL) tablet 650 mg  650 mg Oral Q6H PRN Eduard Clos       Or  . acetaminophen (TYLENOL) suppository 650 mg  650 mg Rectal Q6H PRN Eduard Clos      . acetaminophen (TYLENOL) tablet 650 mg  650 mg Oral Once Forbes Cellar, MD   650 mg at 04/08/11 2330  . amLODipine (NORVASC) tablet 5 mg  5 mg Oral Daily Eduard Clos      . buPROPion (WELLBUTRIN SR) 12 hr tablet 150 mg  150 mg Oral BID Eduard Clos      . cyclobenzaprine (FLEXERIL) tablet 5 mg  5 mg Oral TID PRN Eduard Clos      . fluticasone (FLONASE) 50 MCG/ACT nasal spray 1 spray  1 spray Each Nare Daily Eduard Clos      . furosemide (LASIX) tablet 60 mg  60 mg Oral BID Eduard Clos      . gabapentin (NEURONTIN) capsule 300 mg  300 mg Oral TID Eduard Clos      . loratadine (CLARITIN) tablet 10 mg  10 mg Oral Daily PRN  Eduard Clos      . metoprolol (LOPRESSOR) tablet 100 mg  100 mg Oral BID Eduard Clos      . ondansetron (ZOFRAN) tablet 4 mg  4 mg Oral Q6H PRN Eduard Clos       Or  . ondansetron (ZOFRAN) injection 4 mg  4 mg Intravenous Q6H PRN Eduard Clos      . pantoprazole (PROTONIX) injection 40 mg  40 mg Intravenous Q12H Eduard Clos      . potassium chloride (K-DUR) CR tablet 10 mEq  10 mEq Oral BID Eduard Clos      . venlafaxine (EFFEXOR-XR) 24 hr capsule 75 mg  75 mg Oral Daily Eduard Clos      . zolpidem (AMBIEN) tablet 5 mg  5 mg Oral QHS Eduard Clos      . DISCONTD: 0.9 %  sodium chloride infusion   Intravenous Continuous Forbes Cellar, MD 125 mL/hr at 04/08/11 2250    . DISCONTD: 0.9 %  sodium chloride infusion   Intravenous STAT Forbes Cellar, MD       Medications Prior to Admission  Medication Sig Dispense Refill  . acetaminophen  (TYLENOL) 500 MG tablet Take 500 mg by mouth every 6 (six) hours as needed.        Marland Kitchen amLODipine (NORVASC) 5 MG tablet Take 1 tablet (5 mg total) by mouth daily.  30 tablet  11  . aspirin 81 MG EC tablet Take 81 mg by mouth daily.        . cyclobenzaprine (FLEXERIL) 10 MG tablet TAKE ONE TABLET BY MOUTH EVERY 8 HOURS AS NEEDED FOR MUSCLE SPASMS  60 tablet  0  . estradiol (ESTRACE) 1 MG tablet One tablet by mouth daily for 21 days, followed by 7 days off- then repeat.  30 tablet  3  . fluticasone (FLONASE) 50 MCG/ACT nasal spray USE ONE SPRAY EACH NOSTRIL EVERY DAY  16 g  2  . gabapentin (NEURONTIN) 300 MG capsule Take 1 capsule (300 mg total) by mouth 3 (three) times daily.  90 capsule  2  . lidocaine (XYLOCAINE) 2 % solution 2 teaspoons swish and spit 3 times daily as needed for mouth pain.  200 mL  0  . loratadine (CLARITIN) 10 MG tablet Take 10 mg by mouth daily as needed. For seasonal allergies      . metoprolol (LOPRESSOR) 100 MG tablet Take 1 tablet (100 mg total) by mouth 2 (two) times daily.  60 tablet  11  . omeprazole (PRILOSEC) 20 MG capsule Take 1 capsule (20 mg total) by mouth daily.  30 capsule  5  . potassium chloride (K-DUR) 10 MEQ tablet Take 10 mEq by mouth 2 (two) times daily.       Marland Kitchen warfarin (COUMADIN) 5 MG tablet Take 5-7.5 mg by mouth daily. Take 1 tab on Sunday and Thursday. Take 1 & 1/2 tabs on Monday, Tuesday, Wednesday, Friday and Saturday      . zolpidem (AMBIEN) 5 MG tablet Take 5 mg by mouth at bedtime.        Results for orders placed during the hospital encounter of 04/08/11 (from the past 48 hour(s))  OCCULT BLOOD X 1 CARD TO LAB, STOOL     Status: Normal   Collection Time   04/08/11  8:56 PM      Component Value Range Comment   Fecal Occult Bld NEGATIVE     CBC     Status:  Abnormal   Collection Time   04/08/11  9:06 PM      Component Value Range Comment   WBC 9.6  4.0 - 10.5 (K/uL)    RBC 1.96 (*) 3.87 - 5.11 (MIL/uL)    Hemoglobin 5.2 (*) 12.0 - 15.0 (g/dL)      HCT 62.1 (*) 30.8 - 46.0 (%)    MCV 90.3  78.0 - 100.0 (fL)    MCH 26.5  26.0 - 34.0 (pg)    MCHC 29.4 (*) 30.0 - 36.0 (g/dL)    RDW 65.7 (*) 84.6 - 15.5 (%)    Platelets 716 (*) 150 - 400 (K/uL)   DIFFERENTIAL     Status: Abnormal   Collection Time   04/08/11  9:06 PM      Component Value Range Comment   Neutrophils Relative 60  43 - 77 (%)    Lymphocytes Relative 27  12 - 46 (%)    Monocytes Relative 11  3 - 12 (%)    Eosinophils Relative 1  0 - 5 (%)    Basophils Relative 1  0 - 1 (%)    Neutro Abs 5.7  1.7 - 7.7 (K/uL)    Lymphs Abs 2.6  0.7 - 4.0 (K/uL)    Monocytes Absolute 1.1 (*) 0.1 - 1.0 (K/uL)    Eosinophils Absolute 0.1  0.0 - 0.7 (K/uL)    Basophils Absolute 0.1  0.0 - 0.1 (K/uL)   BASIC METABOLIC PANEL     Status: Abnormal   Collection Time   04/08/11  9:06 PM      Component Value Range Comment   Sodium 134 (*) 135 - 145 (mEq/L)    Potassium 4.9  3.5 - 5.1 (mEq/L)    Chloride 99  96 - 112 (mEq/L)    CO2 24  19 - 32 (mEq/L)    Glucose, Bld 114 (*) 70 - 99 (mg/dL)    BUN 21  6 - 23 (mg/dL)    Creatinine, Ser 9.62 (*) 0.50 - 1.10 (mg/dL)    Calcium 9.2  8.4 - 10.5 (mg/dL)    GFR calc non Af Amer 40 (*) >90 (mL/min)    GFR calc Af Amer 46 (*) >90 (mL/min)   PROTIME-INR     Status: Abnormal   Collection Time   04/08/11  9:06 PM      Component Value Range Comment   Prothrombin Time 25.6 (*) 11.6 - 15.2 (seconds)    INR 2.29 (*) 0.00 - 1.49    TROPONIN I     Status: Normal   Collection Time   04/08/11  9:33 PM      Component Value Range Comment   Troponin I <0.30  <0.30 (ng/mL)    No results found.  Review of Systems  Constitutional: Positive for malaise/fatigue.  HENT: Negative.   Eyes: Negative.   Respiratory: Positive for shortness of breath.   Cardiovascular: Negative.   Gastrointestinal: Positive for blood in stool.  Genitourinary: Negative.   Musculoskeletal: Negative.   Skin: Negative.   Neurological: Positive for dizziness and weakness.   Endo/Heme/Allergies: Negative.   Psychiatric/Behavioral: Negative.     Blood pressure 116/62, pulse 83, temperature 97.5 F (36.4 C), temperature source Oral, resp. rate 21, height 5\' 1"  (1.549 m), weight 89.8 kg (197 lb 15.6 oz), SpO2 100.00%. Physical Exam  Constitutional: She is oriented to person, place, and time. She appears well-developed and well-nourished. No distress.  HENT:  Head: Normocephalic and atraumatic.  Right Ear: External  ear normal.  Left Ear: External ear normal.  Nose: Nose normal.  Mouth/Throat: Oropharynx is clear and moist. No oropharyngeal exudate.  Eyes: Pupils are equal, round, and reactive to light. Right eye exhibits no discharge. Left eye exhibits no discharge. No scleral icterus.       Pallor  Neck: Normal range of motion. Neck supple.  Cardiovascular: Normal rate, regular rhythm and normal heart sounds.   Respiratory: Effort normal and breath sounds normal. No respiratory distress. She has no wheezes. She has no rales.  GI: Soft. Bowel sounds are normal. She exhibits no distension. There is no tenderness. There is no rebound.  Musculoskeletal: Normal range of motion. She exhibits no edema and no tenderness.  Neurological: She is alert and oriented to person, place, and time.       Moves upper and lower extremities.  Skin: Skin is warm and dry. No rash noted. She is not diaphoretic. No erythema.  Psychiatric: Her behavior is normal.     Assessment/Plan #1. Symptomatic anemia with rectal bleeding - at this time patient will be transfused 2 units packed red blood cells. Recheck CBC after transfusion. Patient is hemodynamically stable. We'll consult GI. #2. History of mechanical mitral valve on Coumadin - we'll continue Coumadin per pharmacy as patient is high-risk for thrombosis and clot formation. #3. Mild renal insufficiency - I think creatinine will improve with transfusion. We will closely follow metabolic panel intake output. #4. Thrombocytosis -  probably reactive we'll closely follow CBC. #5. History of hypertension and asthma - continue present medications.  CODE STATUS - full code.  Janis Cuffe N. 04/09/2011, 1:27 AM

## 2011-04-10 ENCOUNTER — Encounter (HOSPITAL_COMMUNITY): Payer: Self-pay | Admitting: *Deleted

## 2011-04-10 ENCOUNTER — Encounter (HOSPITAL_COMMUNITY)
Admission: EM | Disposition: A | Discharge status: Home / Self Care | Payer: Self-pay | Source: Ambulatory Visit | Attending: Internal Medicine

## 2011-04-10 LAB — BASIC METABOLIC PANEL
CO2: 25 mEq/L (ref 19–32)
Calcium: 9.5 mg/dL (ref 8.4–10.5)
Chloride: 101 mEq/L (ref 96–112)
Sodium: 136 mEq/L (ref 135–145)

## 2011-04-10 LAB — CBC
MCV: 87.8 fL (ref 78.0–100.0)
Platelets: 585 10*3/uL — ABNORMAL HIGH (ref 150–400)
RBC: 2.94 MIL/uL — ABNORMAL LOW (ref 3.87–5.11)
WBC: 6.2 10*3/uL (ref 4.0–10.5)

## 2011-04-10 LAB — PROTIME-INR: INR: 2.12 — ABNORMAL HIGH (ref 0.00–1.49)

## 2011-04-10 SURGERY — COLONOSCOPY WITH ESOPHAGOGASTRODUODENOSCOPY (EGD)
Anesthesia: Moderate Sedation

## 2011-04-10 MED ORDER — SODIUM CHLORIDE 0.9 % IV SOLN
Freq: Once | INTRAVENOUS | Status: AC
  Administered 2011-04-10: 500 mL via INTRAVENOUS

## 2011-04-10 MED ORDER — DIPHENHYDRAMINE HCL 50 MG/ML IJ SOLN
INTRAMUSCULAR | Status: DC | PRN
  Administered 2011-04-10: 25 mg via INTRAVENOUS

## 2011-04-10 MED ORDER — FENTANYL CITRATE 0.05 MG/ML IJ SOLN
INTRAMUSCULAR | Status: DC | PRN
  Administered 2011-04-10 (×4): 25 ug via INTRAVENOUS

## 2011-04-10 MED ORDER — DIPHENHYDRAMINE HCL 50 MG/ML IJ SOLN
INTRAMUSCULAR | Status: AC
  Filled 2011-04-10: qty 1

## 2011-04-10 MED ORDER — MIDAZOLAM HCL 10 MG/2ML IJ SOLN
INTRAMUSCULAR | Status: AC
  Filled 2011-04-10: qty 4

## 2011-04-10 MED ORDER — FENTANYL CITRATE 0.05 MG/ML IJ SOLN
INTRAMUSCULAR | Status: AC
  Filled 2011-04-10: qty 4

## 2011-04-10 MED ORDER — MIDAZOLAM HCL 10 MG/2ML IJ SOLN
INTRAMUSCULAR | Status: DC | PRN
  Administered 2011-04-10 (×5): 2 mg via INTRAVENOUS

## 2011-04-10 MED ORDER — BUTAMBEN-TETRACAINE-BENZOCAINE 2-2-14 % EX AERO
INHALATION_SPRAY | CUTANEOUS | Status: DC | PRN
  Administered 2011-04-10: 2 via TOPICAL

## 2011-04-10 NOTE — Progress Notes (Signed)
Utilization Review Completed.Lacora Folmer T1/01/2012   

## 2011-04-10 NOTE — Progress Notes (Signed)
ANTICOAGULATION CONSULT NOTE - Follow Up Consult  Pharmacy Consult for heparin + coumadin Indication: MVR  Vital Signs: Temp: 98.3 F (36.8 C) (01/11 0348) Temp src: Oral (01/11 0348) BP: 116/65 mmHg (01/11 0348) Pulse Rate: 86  (01/11 0348)  Labs:  Basename 04/10/11 1000 04/09/11 2142 04/09/11 1045 04/08/11 2133 04/08/11 2106 04/08/11 1246  HGB 8.0* -- 7.9* -- -- --  HCT 25.8* -- 25.4* -- 17.7* --  PLT 585* -- 609* -- 716* --  APTT -- -- -- -- -- --  LABPROT 24.1* -- -- -- 25.6* --  INR 2.12* -- -- -- 2.29* --  HEPARINUNFRC 0.49 0.18* -- -- -- --  CREATININE -- -- 0.99 -- 1.50* 1.43*  CKTOTAL -- -- -- -- -- --  CKMB -- -- -- -- -- --  TROPONINI -- -- -- <0.30 -- --   Estimated Creatinine Clearance: 69.3 ml/min (by C-G formula based on Cr of 0.99).  Assessment: 50 yof on coumadin PTA for MVR. Started on heparin while coumadin is on hold. Heparin level is at goal at 0.49. Surprisingly, INR is still elevated at 2.12 despite administration of vitamin K. However, due to MVR, required INR is 2.5-3.5 so ok to continue with heparin. No new bleeding noted. CBC stable.  Goal of Therapy:  Heparin level 0.3-0.7 units/ml INR 2.5-3.5   Plan:  1. Continue heparin at 1150units/hr 2. F/u AM INR and heparin level and further plans per GI  Julia Garza, Drake Leach 04/10/2011,10:59 AM

## 2011-04-10 NOTE — Progress Notes (Signed)
Results of EGD/colonoscopy: no obvious lesion to explain anemia, no Avm's seen. OK to restart Heparin and Coumadin. Advance diet. Iron supplements. If she remains heme positiv/anemic, would consider SBCEndosocpy to look for SB avm's.. Will sign off,  PPI for GERD prn

## 2011-04-10 NOTE — Progress Notes (Signed)
Subjective: 51 year old female with history of mechanical mitral valve on Coumadin, history of hypertension and asthma had gone to her primary care physician and was complaining of weakness and dizziness in addition to rectal bleeding. Patient has been having these symptoms for over a week now. Hemoglobin done at the primary care's office revealed a Hgb of 5.9 and stool was positive for occult blood. Patient was referred for admission to the acute units.  The patient is seen post-endo.  She has no new complaints this afternoon.  She denies any recurrent bleeding.  She denies cp, sob, n/v, or abdom pain.  Objective: Weight change:   Intake/Output Summary (Last 24 hours) at 04/10/11 1445 Last data filed at 04/09/11 1859  Gross per 24 hour  Intake  28.65 ml  Output   1000 ml  Net -971.35 ml   Blood pressure 120/71, pulse 86, temperature 98.2 F (36.8 C), temperature source Oral, resp. rate 14, height 5\' 1"  (1.549 m), weight 89.8 kg (197 lb 15.6 oz), SpO2 100.00%.  Physical Exam: General: No acute respiratory distress Lungs: Clear to auscultation bilaterally without wheezes or crackles Cardiovascular: Regular rate and rhythm without murmur gallop or rub normal S1 and S2 Abdomen: Nontender, nondistended, soft, bowel sounds positive, no rebound, no ascites, no appreciable mass Extremities: No significant cyanosis, clubbing, or edema bilateral lower extremities  Lab Results:  Basename 04/10/11 1000 04/09/11 1045 04/08/11 2106  NA 136 134* 134*  K 3.6 4.2 4.9  CL 101 100 99  CO2 25 25 24   GLUCOSE 105* 93 114*  BUN 12 23 21   CREATININE 0.90 0.99 1.50*  CALCIUM 9.5 9.8 9.2  MG -- -- --  PHOS -- -- --    Basename 04/09/11 1045  AST 20  ALT 10  ALKPHOS 77  BILITOT 0.3  PROT 7.6  ALBUMIN 3.7    Basename 04/10/11 1000 04/09/11 1045 04/08/11 2106 04/08/11 1208  WBC 6.2 7.1 9.6 --  NEUTROABS -- -- 5.7 5.7  HGB 8.0* 7.9* 5.2* --  HCT 25.8* 25.4* 17.7* --  MCV 87.8 88.5 90.3 --    PLT 585* 609* 716* --    Basename 04/08/11 2133  CKTOTAL --  CKMB --  CKMBINDEX --  TROPONINI <0.30   No results found for this basename: HGBA1C:12 in the last 72 hours  Micro Results: Recent Results (from the past 240 hour(s))  MRSA PCR SCREENING     Status: Normal   Collection Time   04/09/11  1:04 AM      Component Value Range Status Comment   MRSA by PCR NEGATIVE  NEGATIVE  Final     Studies/Results: All recent x-ray/radiology reports have been reviewed in detail.   Medications: I have reviewed the patient's complete medication list.  Assessment/Plan:  Acute blood loss anemia Hemoglobin nadir was 5.2 - has improved - will cont to follow in a serial fashion   GI bleed No significant findings on endo eval - see f/u plan as per Dr. Juanda Chance  Thrombocytosis Likely a result of anemia/bleeding - f/u in AM  History of rheumatic heart disease status post mechanical mitral valve April 2012 On chronic Coumadin therapy-heparin for overlap until INR at goal again  Hypertension Controlled  History of asthma Well compensated  Lonia Blood, MD [facility] Office  [phone] Pager [phone]  On-Call/Text Page:      [email or web address]      password [facility]

## 2011-04-10 NOTE — Op Note (Signed)
[facility] [address] [place], [place]  [address]  ENDOSCOPY PROCEDURE REPORT  PATIENT:  Julia, Garza  MR#:  [id number] BIRTHDATE:  27-Nov-1960, 50 yrs. old  GENDER:  female  ENDOSCOPIST:  Hedwig Morton. Juanda Chance, MD Referred by:  Dr Forbes Cellar  PROCEDURE DATE:  04/10/2011 PROCEDURE:  EGD, diagnostic 43235 ASA CLASS:  Class III INDICATIONS:  anemia, GERD  MEDICATIONS:   These medications were titrated to patient response per physician's verbal order, Versed 4 mg, Fentanyl 50 mcg TOPICAL ANESTHETIC:  Cetacaine Spray  DESCRIPTION OF PROCEDURE:   After the risks benefits and alternatives of the procedure were thoroughly explained, informed consent was obtained.  The EG-[id number] ([id number]) endoscope was introduced through the mouth and advanced to the second portion of the duodenum, without limitations.  The instrument was slowly withdrawn as the mucosa was fully examined. <<PROCEDUREIMAGES>>  The upper, middle, and distal third of the esophagus were carefully inspected and no abnormalities were noted. The z-line was well seen at the GEJ. The endoscope was pushed into the fundus which was normal including a retroflexed view. The antrum,gastric body, first and second part of the duodenum were unremarkable (see image001, image002, image003, image004, image005, and image006). normal z-line, no hiatal hernia,    Retroflexed views revealed no abnormalities.    The scope was then withdrawn from the patient and the procedure completed.  COMPLICATIONS:  None  ENDOSCOPIC IMPRESSION: 1) Normal EGD RECOMMENDATIONS: colonoscopy  REPEAT EXAM:  In 0 year(s) for.  ______________________________ Hedwig Morton. Juanda Chance, MD  CC:  n. eSIGNED:   Hedwig Morton. Brodie at 04/10/2011 04:01 PM  Woodroe Mode, [phone]

## 2011-04-11 LAB — CBC
HCT: 24.1 % — ABNORMAL LOW (ref 36.0–46.0)
RBC: 2.71 MIL/uL — ABNORMAL LOW (ref 3.87–5.11)
RDW: 16 % — ABNORMAL HIGH (ref 11.5–15.5)
WBC: 6.5 10*3/uL (ref 4.0–10.5)

## 2011-04-11 LAB — PROTIME-INR
INR: 1.45 (ref 0.00–1.49)
Prothrombin Time: 17.9 seconds — ABNORMAL HIGH (ref 11.6–15.2)

## 2011-04-11 MED ORDER — WARFARIN SODIUM 7.5 MG PO TABS
7.5000 mg | ORAL_TABLET | Freq: Once | ORAL | Status: DC
  Filled 2011-04-11: qty 1

## 2011-04-11 MED ORDER — POLYSACCHARIDE IRON 150 MG PO CAPS
150.0000 mg | ORAL_CAPSULE | Freq: Every day | ORAL | Status: DC
Start: 2011-04-11 — End: 2011-04-11
  Administered 2011-04-11: 150 mg via ORAL
  Filled 2011-04-11: qty 1

## 2011-04-11 MED ORDER — POLYSACCHARIDE IRON 150 MG PO CAPS: 150.0000 mg | ORAL_CAPSULE | Freq: Every day | ORAL | Status: DC

## 2011-04-11 NOTE — Progress Notes (Signed)
04/11/2011 12:42 PM  Discussed discharge instructions and medications with patient and family member.Patient is aware of her follow up apts. Answered all questions. Vss. Pt has no complaints of pain. Patient has no signs of active bleeding and understands signs and symptoms of bleeding. All IV's are dc'ed with tips intact. Pt discharged home with family.  Elijah Birk Annette 1/12/201312:42 PM

## 2011-04-11 NOTE — Discharge Summary (Signed)
DISCHARGE SUMMARY  Julia Garza  MR#: [id number]  DOB:1960/05/21  Date of Admission: 04/08/2011 Date of Discharge: 04/11/2011  Attending Physician:Arisbeth Purrington T  Patient's PCP:O'SULLIVAN,MELISSA S., NP, NP  Consults: -Dr. Lina Sar, [facility]  Discharge Diagnoses: Present on Admission:  Rectal bleeding Acute blood loss anemia  Anticoagulation due to mechanical mitral valve  Current Discharge Medication List    START taking these medications   Details  polysaccharide iron (NIFEREX) 150 MG CAPS capsule Take 1 capsule (150 mg total) by mouth daily. Qty: 30 each, Refills: 0      CONTINUE these medications which have NOT CHANGED   Details  acetaminophen (TYLENOL) 500 MG tablet Take 500 mg by mouth every 6 (six) hours as needed.      amLODipine (NORVASC) 5 MG tablet Take 1 tablet (5 mg total) by mouth daily. Qty: 30 tablet, Refills: 11   Associated Diagnoses: Aortic valve disorders; Essential hypertension, benign    buPROPion (WELLBUTRIN SR) 150 MG 12 hr tablet Take 1 tablet (150 mg total) by mouth 2 (two) times daily. Qty: 60 tablet, Refills: 1    cyclobenzaprine (FLEXERIL) 10 MG tablet TAKE ONE TABLET BY MOUTH EVERY 8 HOURS AS NEEDED FOR MUSCLE SPASMS Qty: 60 tablet, Refills: 0    estradiol (ESTRACE) 1 MG tablet One tablet by mouth daily for 21 days, followed by 7 days off- then repeat. Qty: 30 tablet, Refills: 3    fluticasone (FLONASE) 50 MCG/ACT nasal spray USE ONE SPRAY EACH NOSTRIL EVERY DAY Qty: 16 g, Refills: 2    gabapentin (NEURONTIN) 300 MG capsule Take 1 capsule (300 mg total) by mouth 3 (three) times daily. Qty: 90 capsule, Refills: 2   Associated Diagnoses: Chest wall pain following surgery    lidocaine (XYLOCAINE) 2 % solution 2 teaspoons swish and spit 3 times daily as needed for mouth pain. Qty: 200 mL, Refills: 0    loratadine (CLARITIN) 10 MG tablet Take 10 mg by mouth daily as needed. For seasonal allergies    metoprolol (LOPRESSOR) 100 MG  tablet Take 1 tablet (100 mg total) by mouth 2 (two) times daily. Qty: 60 tablet, Refills: 11   Associated Diagnoses: Hypertension    omeprazole (PRILOSEC) 20 MG capsule Take 1 capsule (20 mg total) by mouth daily. Qty: 30 capsule, Refills: 5    venlafaxine (EFFEXOR-XR) 75 MG 24 hr capsule Take 1 capsule (75 mg total) by mouth daily. Qty: 30 capsule, Refills: 2    warfarin (COUMADIN) 5 MG tablet Take 5-7.5 mg by mouth daily. Take 1 tab on Sunday and Thursday. Take 1 & 1/2 tabs on Monday, Tuesday, Wednesday, Friday and Saturday    zolpidem (AMBIEN) 5 MG tablet Take 5 mg by mouth at bedtime.      STOP taking these medications     aspirin 81 MG EC tablet      furosemide (LASIX) 40 MG tablet      potassium chloride (K-DUR) 10 MEQ tablet          Hospital Course: 51 year old female with history of mechanical mitral valve on Coumadin, history of hypertension and asthma had gone to her primary care physician and was complaining of weakness and dizziness in addition to rectal bleeding. Patient has been having these symptoms for over a week now. Hemoglobin done at the primary care's office revealed a Hgb of 5.9 and stool was positive for occult blood. Patient was referred for admission to the acute units.   After admission GI was consulted for evaluation.  The  patient's Coumadin was temporarily held and she was placed on a heparin drip instead.  The patient was transfused with packed red blood cells and tolerated this well.  Her hemoglobin nadir was 5.2.  After transfusion she reached a peak hemoglobin of 8.0.  She appears to have stabilized with a hemoglobin of approximate 7.6 the time of her discharge.  After the patient was stabilized hemodynamically with volume resuscitation and transfusion of packed red blood cells Dr. Juanda Chance took the patient to the endoscopy suite.  She underwent an EGD as well as a colonoscopy.  No obvious lesions were noted to explain the patient's anemia.  No AVMs  were noted.  The patient was returned to [place] in stable condition.  Her diet was advanced and her Coumadin was resumed.  It is noted that we will need to consider a small bowel capsule endoscopy to evaluate for small bowel AVMs in the outpatient setting.  At the time of the patient's discharge she is ambulating about her room without any difficulty.  She denies weakness dizziness or lightheadedness.  She is tolerating regular diet without difficulty and has had no further obvious bleeding.  She is advised to return to the emergency room immediately if she develops dizziness lightheadedness or clear evidence of recurrent GI bleeding.  She is advised to followup with her primary care provider early next week to have a CBC checked and to resume followup with [facility] early next week as well.  Day of Discharge BP 113/59  Pulse 90  Temp(Src) 97.7 F (36.5 C) (Axillary)  Resp 14  Ht 5\' 1"  (1.549 m)  Wt 90 kg (198 lb 6.6 oz)  BMI 37.49 kg/m2  SpO2 98%  Physical Exam: General: No acute respiratory distress Lungs: Clear to auscultation bilaterally without wheezes or crackles Cardiovascular: Regular rate and rhythm without murmur gallop or rub normal S1 and S2 Abdomen: Nontender, nondistended, soft, bowel sounds positive, no rebound, no ascites, no appreciable mass Extremities: No significant cyanosis, clubbing, or edema bilateral lower extremities   Results for orders placed during the hospital encounter of 04/08/11 (from the past 24 hour(s))  CBC     Status: Abnormal   Collection Time   04/11/11  8:08 AM      Component Value Range   WBC 6.5  4.0 - 10.5 (K/uL)   RBC 2.71 (*) 3.87 - 5.11 (MIL/uL)   Hemoglobin 7.6 (*) 12.0 - 15.0 (g/dL)   HCT 66.4 (*) 40.3 - 46.0 (%)   MCV 88.9  78.0 - 100.0 (fL)   MCH 28.0  26.0 - 34.0 (pg)   MCHC 31.5  30.0 - 36.0 (g/dL)   RDW 47.4 (*) 25.9 - 15.5 (%)   Platelets 511 (*) 150 - 400 (K/uL)  HEPARIN LEVEL (UNFRACTIONATED)     Status: Normal    Collection Time   04/11/11  8:08 AM      Component Value Range   Heparin Unfractionated 0.40  0.30 - 0.70 (IU/mL)  PROTIME-INR     Status: Abnormal   Collection Time   04/11/11  8:08 AM      Component Value Range   Prothrombin Time 17.9 (*) 11.6 - 15.2 (seconds)   INR 1.45  0.00 - 1.49    Disposition: Discharge home  Follow-up Appts: Discharge Orders    Future Appointments: Provider: Department: Dept Phone: Center:   04/17/2011 7:30 AM Edwina Junious [facility]  None   04/17/2011 9:25 AM Lorenda Cahill [facility]  [phone] [facility]   04/27/2011 2:00 PM Purcell Nails, MD [facility] [phone] [facility]   04/29/2011 12:30 PM Lewayne Bunting, MD [facility] [phone] [facility]   05/01/2011 4:15 PM Tiffany Daneil Dan, RN [facility] [phone] None     Future Orders Please Complete By Expires   Diet - low sodium heart healthy      Increase activity slowly         Follow-up Information    Follow up with Lemont Fillers., NP. Schedule an appointment as soon as possible for a visit in 3 days.      Follow up with [facility] in 2 days.   Contact information:   [address] [place] [address] [phone]         Tests Needing Follow-up: A repeat CBC is recommended next week when the patient follows up with [facility].  A repeat INR is recommended when the patient follows up in the [facility].  Time spent in discharge (includes decision making & examination of pt): >30 minutes  Signed: Dlynn Ranes T 04/11/2011, 10:19 AM

## 2011-04-11 NOTE — Progress Notes (Signed)
ANTICOAGULATION CONSULT NOTE - Follow Up Consult  Pharmacy Consult for heparin + coumadin Indication: MVR   Assessment: 50 yof on coumadin PTA for MVR.  She continues on IV heparin therapy.  She is s/p EGD/colongoscopy without noted complications and now plans to restart her Warfarin therapy.  Will begin home dose and can hopefully get her to goal range soon.  Goal of Therapy:  Heparin level 0.3-0.7 units/ml INR 2.5-3.5   Plan:  1.  Warfarin 7.5mg  PO x 1  Nadara Mustard, PharmD., MS Clinical Pharmacist Pager:   [phone] 04/11/2011,12:23 AM

## 2011-04-13 ENCOUNTER — Telehealth: Payer: Self-pay | Admitting: Cardiology

## 2011-04-13 ENCOUNTER — Ambulatory Visit: Payer: Managed Care, Other (non HMO) | Admitting: Thoracic Surgery (Cardiothoracic Vascular Surgery)

## 2011-04-13 NOTE — Op Note (Signed)
[facility] [address] [place], [place]  [address]  COLONOSCOPY PROCEDURE REPORT  PATIENT:  Julia Garza, Julia Garza  MR#:  [id number] BIRTHDATE:  28-Sep-1960, 50 yrs. old  GENDER:  female ENDOSCOPIST:  Hedwig Morton. Juanda Chance, MD REF. BY:  Dr Forbes Cellar PROCEDURE DATE:  04/10/2011 PROCEDURE:  Colonoscopy 19147 ASA CLASS:  Class III INDICATIONS:  Anemia MEDICATIONS:   These medications were titrated to patient response per physician's verbal order, Versed 6 mg, Fentanyl 50 mcg, Benadryl 25 mg  DESCRIPTION OF PROCEDURE:   After the risks and benefits and of the procedure were explained, informed consent was obtained. Digital rectal exam was performed and revealed no rectal masses. The EC-[id number] ([id number]) endoscope was introduced through the anus and advanced to the cecum, which was identified by both the appendix and ileocecal valve.  The quality of the prep was good, using MoviPrep.  The instrument was then slowly withdrawn as the colon was fully examined. <<PROCEDUREIMAGES>>  FINDINGS:  No polyps or cancers were seen (see image001, image002, image003, image004, and image005).   Retroflexed views in the rectum revealed no abnormalities.    The scope was then withdrawn from the patient and the procedure completed.  COMPLICATIONS:  None ENDOSCOPIC IMPRESSION: 1) No polyps or cancers 2) Normal colonoscopy RECOMMENDATIONS: follow H/H, OK to resume Coumadin, treat symptomatic GERD, if bleeding continues,consider SBCE to look for avm's  REPEAT EXAM:  In 10 year(s) for.  ______________________________ Hedwig Morton. Juanda Chance, MD  CC:  n. eSIGNED:   Hedwig Morton. Nomie Buchberger at 04/10/2011 04:22 PM  Woodroe Mode, [phone]

## 2011-04-13 NOTE — Telephone Encounter (Addendum)
Called patient at home and she advised me that she was admitted to the hospital with a GI bleed and sent home with no instructions to take Lasix,Asa,or Kcl. She now weighs 198 up from 195. Feels a little bloated in the abdomen. Advised I will call Dr.Crenshaw with instructions. Discussed above with Dr.Crenshaw and he ordered for her to take lasix 20mg  BID, KCL QD, and HOLD ASA and return for a BMET in about 1 week. Called patient back and advised her of above and she verbalized understanding. She will return for an echo and BMET on 1/18.

## 2011-04-13 NOTE — Telephone Encounter (Signed)
LMTCB

## 2011-04-13 NOTE — Telephone Encounter (Signed)
Pt calling re being dc from hospital, was taken off lasix and asa, does she needs to continue or stay off it?

## 2011-04-13 NOTE — Telephone Encounter (Signed)
Fu call °Patient returning your call °

## 2011-04-14 ENCOUNTER — Encounter: Payer: Self-pay | Admitting: Family

## 2011-04-14 ENCOUNTER — Encounter: Payer: Managed Care, Other (non HMO) | Admitting: *Deleted

## 2011-04-14 ENCOUNTER — Ambulatory Visit (INDEPENDENT_AMBULATORY_CARE_PROVIDER_SITE_OTHER): Payer: Managed Care, Other (non HMO) | Admitting: Family

## 2011-04-14 ENCOUNTER — Telehealth: Payer: Self-pay | Admitting: Family

## 2011-04-14 ENCOUNTER — Telehealth: Payer: Self-pay | Admitting: *Deleted

## 2011-04-14 DIAGNOSIS — E876 Hypokalemia: Secondary | ICD-10-CM

## 2011-04-14 DIAGNOSIS — D649 Anemia, unspecified: Secondary | ICD-10-CM

## 2011-04-14 DIAGNOSIS — R5383 Other fatigue: Secondary | ICD-10-CM

## 2011-04-14 DIAGNOSIS — I509 Heart failure, unspecified: Secondary | ICD-10-CM

## 2011-04-14 DIAGNOSIS — R5381 Other malaise: Secondary | ICD-10-CM

## 2011-04-14 DIAGNOSIS — D500 Iron deficiency anemia secondary to blood loss (chronic): Secondary | ICD-10-CM

## 2011-04-14 LAB — CBC WITH DIFFERENTIAL/PLATELET
HCT: 30 % — ABNORMAL LOW (ref 36.0–46.0)
Hemoglobin: 9.1 g/dL — ABNORMAL LOW (ref 12.0–15.0)
Lymphs Abs: 2 10*3/uL (ref 0.7–4.0)
Monocytes Absolute: 0.7 10*3/uL (ref 0.1–1.0)
Monocytes Relative: 9 % (ref 3–12)
Neutro Abs: 5.2 10*3/uL (ref 1.7–7.7)
Neutrophils Relative %: 65 % (ref 43–77)
RBC: 3.38 MIL/uL — ABNORMAL LOW (ref 3.87–5.11)

## 2011-04-14 LAB — BASIC METABOLIC PANEL
Calcium: 9.8 mg/dL (ref 8.4–10.5)
Chloride: 101 mEq/L (ref 96–112)
Creat: 1 mg/dL (ref 0.50–1.10)
Sodium: 136 mEq/L (ref 135–145)

## 2011-04-14 NOTE — Assessment & Plan Note (Addendum)
CBC performed today is improved at 9.1.   Continue PPI for GI prophylaxis.  Should she have recurrent bleeding, she will need further GI eval for capsule endoscopy. She is back on coumadin and is scheduled for follow up in the coumadin clinic on Friday of this week.

## 2011-04-14 NOTE — Assessment & Plan Note (Signed)
Clinically euvolemic.  She is currently on lasix 40mg  bid at the direction of Dr. Jens Som.  SOB is improved post transfusion.

## 2011-04-14 NOTE — Telephone Encounter (Signed)
Pt wants to clarify how she is supposed to take her potassium? Thinks she should be taking 1 a day but med list says 2 a day. Please call pt and ok to leave detailed message on cell.

## 2011-04-14 NOTE — Patient Instructions (Signed)
Please complete your blood work prior to leaving.  Follow up in 1 month, sooner if problems/concerns. Go to ER if you develop recurrent blood in stool.

## 2011-04-14 NOTE — Progress Notes (Signed)
Subjective:    Patient ID: Julia Garza, female    DOB: 1960/05/22, 51 y.o.   MRN: [id number]  HPI  [name] is a 51 yr old female who presents today for hospital follow up.  She was seen in the office on 1/9 with weakness.  A stat CBC revealed severe anemia (Hgb5.9).  She was referred to the ED and admitted to [facility] for transfusion and further evaluation. Records are reviewed. She received 2 units of PRBC during this admission.  HbB at time of discharge was 7.6.  During this hospitalization she underwent a colonoscopy and endoscopy due to heme + stools and this work up was negative.  It was felt that a small bowel capsule endoscopy might be needed as an outpatient to evaluate for small bowel AVMs.   Since returning home she reports that her shortness of breath is improved since she received her transfusions.  She is less fatigued.  Concentration is improved.  She denies any further BRBPR.        Review of Systems  Respiratory: Negative for shortness of breath.   Cardiovascular: Negative for chest pain.       Mild LE edema last night.     Past Medical History  Diagnosis Date  . Hypertension   . Hyperlipidemia   . GERD (gastroesophageal reflux disease)   . Depression   . Asthma   . Eczema   . History of rheumatic fever   . Right shoulder injury     history of  . Mitral stenosis with regurgitation   . Syphilis prior 2006    Pt was treated for + RPR/titer  . Herpes     History   Social History  . Marital Status: Single    Spouse Name: N/A    Number of Children: 3  . Years of Education: N/A   Occupational History  . SALES    Social History Main Topics  . Smoking status: Former Games developer  . Smokeless tobacco: Not on file   Comment: briefly as teenager  . Alcohol Use: No  . Drug Use: No  . Sexually Active: No   Other Topics Concern  . Not on file   Social History Narrative   Regular exercise: yes    Past Surgical History  Procedure Date  . Cesarean section 1991    . Abdominal hysterectomy 01/2004    partial  . Breast surgery 05/2007    left breast biposy  . Femur fracture surgery 1983    right  . Mitral valve replacement 07/16/2010    #62mm Carbomedics mechanical prosthesis via right mini thoracotomy    Family History  Problem Relation Age of Onset  . Heart disease Mother   . Kidney disease Mother   . Diabetes Mother   . Thyroid disease Mother   . Colon polyps Mother   . Heart attack Mother   . Hypertension Mother   . Heart disease Father   . Heart attack Father     No Known Allergies  Current Outpatient Prescriptions on File Prior to Visit  Medication Sig Dispense Refill  . acetaminophen (TYLENOL) 500 MG tablet Take 500 mg by mouth every 6 (six) hours as needed.        Marland Kitchen amLODipine (NORVASC) 5 MG tablet Take 1 tablet (5 mg total) by mouth daily.  30 tablet  11  . buPROPion (WELLBUTRIN SR) 150 MG 12 hr tablet Take 1 tablet (150 mg total) by mouth 2 (two) times daily.  60 tablet  1  . cyclobenzaprine (FLEXERIL) 10 MG tablet TAKE ONE TABLET BY MOUTH EVERY 8 HOURS AS NEEDED FOR MUSCLE SPASMS  60 tablet  0  . estradiol (ESTRACE) 1 MG tablet One tablet by mouth daily for 21 days, followed by 7 days off- then repeat.  30 tablet  3  . fluticasone (FLONASE) 50 MCG/ACT nasal spray USE ONE SPRAY EACH NOSTRIL EVERY DAY  16 g  2  . furosemide (LASIX) 40 MG tablet Take 1 tablet (40 mg total) by mouth 2 (two) times daily.  60 tablet  11  . gabapentin (NEURONTIN) 300 MG capsule Take 1 capsule (300 mg total) by mouth 3 (three) times daily.  90 capsule  2  . lidocaine (XYLOCAINE) 2 % solution 2 teaspoons swish and spit 3 times daily as needed for mouth pain.  200 mL  0  . loratadine (CLARITIN) 10 MG tablet Take 10 mg by mouth daily as needed. For seasonal allergies      . metoprolol (LOPRESSOR) 100 MG tablet Take 1 tablet (100 mg total) by mouth 2 (two) times daily.  60 tablet  11  . omeprazole (PRILOSEC) 20 MG capsule Take 1 capsule (20 mg total) by  mouth daily.  30 capsule  5  . polysaccharide iron (NIFEREX) 150 MG CAPS capsule Take 1 capsule (150 mg total) by mouth daily.  30 each  0  . potassium chloride (K-DUR) 10 MEQ tablet Take 2 tablets (20 mEq total) by mouth daily.      Marland Kitchen venlafaxine (EFFEXOR-XR) 75 MG 24 hr capsule Take 1 capsule (75 mg total) by mouth daily.  30 capsule  2  . warfarin (COUMADIN) 5 MG tablet Take 5-7.5 mg by mouth daily. Take 1 tab on Sunday and Thursday. Take 1 & 1/2 tabs on Monday, Tuesday, Wednesday, Friday and Saturday      . zolpidem (AMBIEN) 5 MG tablet Take 5 mg by mouth at bedtime.        BP 126/88  Pulse 84  Temp(Src) 97.8 F (36.6 C) (Oral)  Resp 16  Wt 194 lb (87.998 kg)       Objective:   Physical Exam  Constitutional: She is oriented to person, place, and time. She appears well-developed and well-nourished.       Still appears pale.   HENT:  Head: Normocephalic and atraumatic.  Eyes: No scleral icterus.  Neck: Neck supple. No thyromegaly present.  Cardiovascular: Normal rate and regular rhythm.   No murmur heard.      + mechanical valve click.   Pulmonary/Chest: Effort normal and breath sounds normal. No respiratory distress. She has no wheezes. She has no rales. She exhibits no tenderness.  Musculoskeletal: She exhibits no edema.  Lymphadenopathy:    She has no cervical adenopathy.  Neurological: She is alert and oriented to person, place, and time.  Skin: Skin is warm and dry.  Psychiatric: Her behavior is normal. Judgment and thought content normal.       Mildly flat affect is noted today.           Assessment & Plan:

## 2011-04-14 NOTE — Assessment & Plan Note (Signed)
Improved, was likely related to severe anemia.

## 2011-04-14 NOTE — Telephone Encounter (Signed)
Spoke with pt, aware her labs today show normal potassium. She will cont on one tablet daily.

## 2011-04-14 NOTE — Telephone Encounter (Signed)
Called pt.  Reviewed CBC results.  She will call us if she sees any blood in stool.

## 2011-04-14 NOTE — Telephone Encounter (Signed)
Received call from [facility] that STAT labs were completed and have resulted in EPIC. Please advise.

## 2011-04-17 ENCOUNTER — Other Ambulatory Visit (HOSPITAL_COMMUNITY): Payer: Managed Care, Other (non HMO) | Admitting: Radiology

## 2011-04-17 ENCOUNTER — Other Ambulatory Visit: Payer: Managed Care, Other (non HMO) | Admitting: *Deleted

## 2011-04-27 ENCOUNTER — Ambulatory Visit: Payer: Managed Care, Other (non HMO) | Admitting: Thoracic Surgery (Cardiothoracic Vascular Surgery)

## 2011-04-28 ENCOUNTER — Other Ambulatory Visit: Payer: Self-pay | Admitting: Family

## 2011-04-28 NOTE — Telephone Encounter (Signed)
Refill left on pharmacy voicemail for Zolpidem 5mg  #30 x no refills.

## 2011-04-29 ENCOUNTER — Ambulatory Visit: Payer: Managed Care, Other (non HMO) | Admitting: Cardiology

## 2011-05-01 ENCOUNTER — Encounter: Payer: Managed Care, Other (non HMO) | Admitting: *Deleted

## 2011-05-07 ENCOUNTER — Encounter: Payer: Self-pay | Admitting: Family Medicine

## 2011-05-07 ENCOUNTER — Ambulatory Visit (INDEPENDENT_AMBULATORY_CARE_PROVIDER_SITE_OTHER): Payer: Managed Care, Other (non HMO) | Admitting: Family Medicine

## 2011-05-07 VITALS — BP 124/85 | HR 89 | Temp 97.5°F | Ht 60.0 in | Wt 190.0 lb

## 2011-05-07 DIAGNOSIS — M25561 Pain in right knee: Secondary | ICD-10-CM

## 2011-05-07 DIAGNOSIS — M25569 Pain in unspecified knee: Secondary | ICD-10-CM

## 2011-05-07 NOTE — Patient Instructions (Addendum)
Your exam is consistent with either a degenerative meniscal tear or mild arthritis (there is mild arthritis on your x-rays). Treatment for both is the same initially. Ice your knee 15 minutes at a time as needed 3-4 times a day. Tylenol 500mg  1-2 tabs three times a day as needed for pain. Consider tramadol or vicodin as needed for severe pain. ACE wrap may help keep swelling down. Elevate above the level of your heart as much as possible. Start straight leg raises and quad sets - 3 sets of 15 each once a day. You were given a cortisone injection for this. There are other types of injections to consider if these do not help. Glucosamine sulfate 750mg  twice a day has been shown to help with pain from arthritis as well. Follow up with me in 1 month or as needed.

## 2011-05-08 ENCOUNTER — Encounter: Payer: Self-pay | Admitting: Family Medicine

## 2011-05-08 DIAGNOSIS — M25561 Pain in right knee: Secondary | ICD-10-CM | POA: Insufficient documentation

## 2011-05-08 NOTE — Assessment & Plan Note (Signed)
2/2 DJD vs degenerative medial meniscal tear.  Has known mild medial DJD.  Oral medication options are limited as she is on coumadin.  Will restart her HEP, icing, ace wrap, elevation.  She would like to go ahead with intraarticular cortisone injection today.  Discussed glucosamine as well.  See instructions for further.  After informed written consent, patient was seated on exam table. Right knee was prepped with alcohol swab and utilizing anterolateral approach, patient's right knee was injected intraarticularly with 3:1 marcaine: depomedrol. Patient tolerated the procedure well without immediate complications.

## 2011-05-08 NOTE — Progress Notes (Signed)
Subjective:    Patient ID: Julia Garza, female    DOB: 1961/03/27, 51 y.o.   MRN: [id number]  Knee Pain    51 yo F here for f/u right knee pain  06/30/10: Patient reports no known injury. Pain started in November 2011 - was intermittent about that time but has been persistent over past 2 weeks. Pain mostly medial but also posterior. Associated with swelling but no bruising. No catching, locking, or giving out. Tried naprosyn, elevation without much help. No prior right knee pain Had x-rays (non weight bearing) which showed mild medial DJD.  05/07/11: Patient reports that her right knee pain had completely improved up until 1 month ago. Started developing anterior knee pain worse medially and behind kneecap. Has since developed posterior pain as well. Not doing anything for pain (icing, meds, sleeve). Pain worse at end of day and with walking. No catching, locking, giving out. No new trauma. Some mild left knee pain that has started only since right knee has been hurting a lot.  Past Medical History  Diagnosis Date  . Hypertension   . Hyperlipidemia   . GERD (gastroesophageal reflux disease)   . Depression   . Asthma   . Eczema   . History of rheumatic fever   . Right shoulder injury     history of  . Mitral stenosis with regurgitation   . Syphilis prior 2006    Pt was treated for + RPR/titer  . Herpes     Current Outpatient Prescriptions on File Prior to Visit  Medication Sig Dispense Refill  . acetaminophen (TYLENOL) 500 MG tablet Take 500 mg by mouth every 6 (six) hours as needed.        Marland Kitchen amLODipine (NORVASC) 5 MG tablet Take 1 tablet (5 mg total) by mouth daily.  30 tablet  11  . buPROPion (WELLBUTRIN SR) 150 MG 12 hr tablet Take 1 tablet (150 mg total) by mouth 2 (two) times daily.  60 tablet  1  . cyclobenzaprine (FLEXERIL) 10 MG tablet TAKE ONE TABLET BY MOUTH EVERY 8 HOURS AS NEEDED FOR MUSCLE SPASMS  60 tablet  0  . estradiol (ESTRACE) 1 MG tablet One tablet  by mouth daily for 21 days, followed by 7 days off- then repeat.  30 tablet  3  . fluticasone (FLONASE) 50 MCG/ACT nasal spray USE ONE SPRAY EACH NOSTRIL EVERY DAY  16 g  2  . furosemide (LASIX) 40 MG tablet Take 1 tablet (40 mg total) by mouth 2 (two) times daily.  60 tablet  11  . gabapentin (NEURONTIN) 300 MG capsule Take 1 capsule (300 mg total) by mouth 3 (three) times daily.  90 capsule  2  . lidocaine (XYLOCAINE) 2 % solution 2 teaspoons swish and spit 3 times daily as needed for mouth pain.  200 mL  0  . loratadine (CLARITIN) 10 MG tablet Take 10 mg by mouth daily as needed. For seasonal allergies      . metoprolol (LOPRESSOR) 100 MG tablet Take 1 tablet (100 mg total) by mouth 2 (two) times daily.  60 tablet  11  . omeprazole (PRILOSEC) 20 MG capsule Take 1 capsule (20 mg total) by mouth daily.  30 capsule  5  . polysaccharide iron (NIFEREX) 150 MG CAPS capsule Take 1 capsule (150 mg total) by mouth daily.  30 each  0  . potassium chloride (K-DUR) 10 MEQ tablet Take 1 tablet (10 mEq total) by mouth daily.      Marland Kitchen  venlafaxine (EFFEXOR-XR) 75 MG 24 hr capsule Take 1 capsule (75 mg total) by mouth daily.  30 capsule  2  . warfarin (COUMADIN) 5 MG tablet Take 5-7.5 mg by mouth daily. Take 1 tab on Sunday and Thursday. Take 1 & 1/2 tabs on Monday, Tuesday, Wednesday, Friday and Saturday      . zolpidem (AMBIEN) 5 MG tablet TAKE ONE TABLET BY MOUTH AT BEDTIME AS NEEDED  30 tablet  0    Past Surgical History  Procedure Date  . Cesarean section 1991  . Abdominal hysterectomy 01/2004    partial  . Breast surgery 05/2007    left breast biposy  . Femur fracture surgery 1983    right  . Mitral valve replacement 07/16/2010    #64mm Carbomedics mechanical prosthesis via right mini thoracotomy    No Known Allergies  History   Social History  . Marital Status: Single    Spouse Name: N/A    Number of Children: 3  . Years of Education: N/A   Occupational History  . SALES    Social  History Main Topics  . Smoking status: Former Games developer  . Smokeless tobacco: Not on file   Comment: briefly as teenager  . Alcohol Use: No  . Drug Use: No  . Sexually Active: No   Other Topics Concern  . Not on file   Social History Narrative   Regular exercise: yes    Family History  Problem Relation Age of Onset  . Heart disease Mother   . Kidney disease Mother   . Diabetes Mother   . Thyroid disease Mother   . Colon polyps Mother   . Heart attack Mother   . Hypertension Mother   . Heart disease Father   . Heart attack Father     BP 124/85  Pulse 89  Temp(Src) 97.5 F (36.4 C) (Oral)  Ht 5' (1.524 m)  Wt 190 lb (86.183 kg)  BMI 37.11 kg/m2  Review of Systems  See HPI above.    Objective:   Physical Exam  Gen: NAD R knee: Mild swelling.  No bruising, erythema, warmth. Mod TTP medial joint line.  Mild TTP post patellar facets.  No lateral TTP.  No other TTP about knee. FROM. Stable to valgus/varus stress.  Negative ant/post drawer, negative lachmanns. Pain medially with mcmurrays and apleys, negative laterally Negative apprehension.  L knee: No gross deformity, ecchymoses, swelling.  Crepitation. No TTP. FROM. Negative ant/post drawers. Negative valgus/varus testing. Negative lachmanns. Negative mcmurrays, apleys, patellar apprehension. NV intact distally.    Assessment & Plan:  1. R knee pain - 2/2 DJD vs degenerative medial meniscal tear.  Has known mild medial DJD.  Oral medication options are limited as she is on coumadin.  Will restart her HEP, icing, ace wrap, elevation.  She would like to go ahead with intraarticular cortisone injection today.  Discussed glucosamine as well.  See instructions for further.  After informed written consent, patient was seated on exam table. Right knee was prepped with alcohol swab and utilizing anterolateral approach, patient's right knee was injected intraarticularly with 3:1 marcaine: depomedrol. Patient tolerated  the procedure well without immediate complications.

## 2011-05-12 ENCOUNTER — Telehealth: Payer: Self-pay | Admitting: Family

## 2011-05-12 ENCOUNTER — Other Ambulatory Visit: Payer: Self-pay | Admitting: Cardiology

## 2011-05-12 ENCOUNTER — Encounter: Payer: Self-pay | Admitting: Family

## 2011-05-12 MED ORDER — POLYSACCHARIDE IRON 150 MG PO CAPS: 150.0000 mg | ORAL_CAPSULE | Freq: Every day | ORAL | Status: AC

## 2011-05-12 NOTE — Telephone Encounter (Signed)
Spoke with patient on phone, she states she cannot afford the co-pay to come in for INR check, informed patient we cannot dispense coumadin without checking her INR. Patient states she cannot come in and hung phone up on nurse.

## 2011-05-12 NOTE — Telephone Encounter (Signed)
Below Rx printed and was called to pharmacy voicemail.

## 2011-05-15 ENCOUNTER — Ambulatory Visit (INDEPENDENT_AMBULATORY_CARE_PROVIDER_SITE_OTHER): Payer: Managed Care, Other (non HMO) | Admitting: *Deleted

## 2011-05-15 DIAGNOSIS — Z7901 Long term (current) use of anticoagulants: Secondary | ICD-10-CM

## 2011-05-15 DIAGNOSIS — Z954 Presence of other heart-valve replacement: Secondary | ICD-10-CM

## 2011-05-15 DIAGNOSIS — I052 Rheumatic mitral stenosis with insufficiency: Secondary | ICD-10-CM

## 2011-05-15 LAB — POCT INR: INR: 3.6

## 2011-05-15 MED ORDER — WARFARIN SODIUM 5 MG PO TABS: 5.0000 mg | ORAL_TABLET | Freq: Every day | ORAL | Status: DC

## 2011-05-26 ENCOUNTER — Other Ambulatory Visit: Payer: Self-pay | Admitting: Family

## 2011-05-27 ENCOUNTER — Encounter: Payer: Self-pay | Admitting: Family

## 2011-05-27 ENCOUNTER — Ambulatory Visit (INDEPENDENT_AMBULATORY_CARE_PROVIDER_SITE_OTHER): Payer: Managed Care, Other (non HMO) | Admitting: Family

## 2011-05-27 VITALS — BP 110/80 | HR 90 | Temp 97.8°F | Resp 18 | Ht 60.0 in | Wt 192.1 lb

## 2011-05-27 DIAGNOSIS — M25561 Pain in right knee: Secondary | ICD-10-CM

## 2011-05-27 DIAGNOSIS — M25569 Pain in unspecified knee: Secondary | ICD-10-CM

## 2011-05-27 DIAGNOSIS — D649 Anemia, unspecified: Secondary | ICD-10-CM

## 2011-05-27 DIAGNOSIS — F3289 Other specified depressive episodes: Secondary | ICD-10-CM

## 2011-05-27 DIAGNOSIS — L259 Unspecified contact dermatitis, unspecified cause: Secondary | ICD-10-CM

## 2011-05-27 DIAGNOSIS — F329 Major depressive disorder, single episode, unspecified: Secondary | ICD-10-CM

## 2011-05-27 LAB — CBC WITH DIFFERENTIAL/PLATELET
Basophils Relative: 1 % (ref 0–1)
Eosinophils Absolute: 0 10*3/uL (ref 0.0–0.7)
HCT: 36.2 % (ref 36.0–46.0)
Hemoglobin: 11 g/dL — ABNORMAL LOW (ref 12.0–15.0)
Lymphocytes Relative: 23 % (ref 12–46)
Lymphs Abs: 1.5 10*3/uL (ref 0.7–4.0)
Monocytes Absolute: 0.4 10*3/uL (ref 0.1–1.0)
Monocytes Relative: 7 % (ref 3–12)
Neutro Abs: 4.3 10*3/uL (ref 1.7–7.7)
RBC: 4.44 MIL/uL (ref 3.87–5.11)
WBC: 6.3 10*3/uL (ref 4.0–10.5)

## 2011-05-27 MED ORDER — BUPROPION HCL ER (SR) 150 MG PO TB12: 150.0000 mg | ORAL_TABLET | Freq: Two times a day (BID) | ORAL | Status: AC

## 2011-05-27 MED ORDER — ESTRADIOL 1 MG PO TABS: ORAL_TABLET | ORAL | Status: AC

## 2011-05-27 MED ORDER — ZOLPIDEM TARTRATE 5 MG PO TABS: 5.0000 mg | ORAL_TABLET | Freq: Every evening | ORAL | Status: DC | PRN

## 2011-05-27 NOTE — Patient Instructions (Signed)
Please complete your blood work prior to leaving today.  Follow up in 3 months.  

## 2011-05-27 NOTE — Progress Notes (Signed)
Subjective:    Patient ID: Julia Garza, female    DOB: 03/10/1961, 51 y.o.   MRN: [id number]  HPI  [name] is a 51 yr old female who presents today for follow up.    Iron deficiency anemia-  Tolerating iron supplement without diarrhea.  Energy is improved.   Hair is growing back.  Left upper arm- notes "bug bite" a few weeks ago.  Was red and itching, now just a bit itchy.  Has used benadryl which has helped.   Depression- feels well on welbutrin.  Wishes to continue.  R knee pain- improved after injection of cortisone.   Review of Systems  Gastrointestinal: Negative for blood in stool.   See HPI  Past Medical History  Diagnosis Date  . Hypertension   . Hyperlipidemia   . GERD (gastroesophageal reflux disease)   . Depression   . Asthma   . Eczema   . History of rheumatic fever   . Right shoulder injury     history of  . Mitral stenosis with regurgitation   . Syphilis prior 2006    Pt was treated for + RPR/titer  . Herpes     History   Social History  . Marital Status: Single    Spouse Name: N/A    Number of Children: 3  . Years of Education: N/A   Occupational History  . SALES    Social History Main Topics  . Smoking status: Former Games developer  . Smokeless tobacco: Not on file   Comment: briefly as teenager  . Alcohol Use: No  . Drug Use: No  . Sexually Active: No   Other Topics Concern  . Not on file   Social History Narrative   Regular exercise: yes    Past Surgical History  Procedure Date  . Cesarean section 1991  . Abdominal hysterectomy 01/2004    partial  . Breast surgery 05/2007    left breast biposy  . Femur fracture surgery 1983    right  . Mitral valve replacement 07/16/2010    #43mm Carbomedics mechanical prosthesis via right mini thoracotomy    Family History  Problem Relation Age of Onset  . Heart disease Mother   . Kidney disease Mother   . Diabetes Mother   . Thyroid disease Mother   . Colon polyps Mother   . Heart  attack Mother   . Hypertension Mother   . Heart disease Father   . Heart attack Father     No Known Allergies  Current Outpatient Prescriptions on File Prior to Visit  Medication Sig Dispense Refill  . acetaminophen (TYLENOL) 500 MG tablet Take 500 mg by mouth every 6 (six) hours as needed.        Marland Kitchen amLODipine (NORVASC) 5 MG tablet Take 1 tablet (5 mg total) by mouth daily.  30 tablet  11  . cyclobenzaprine (FLEXERIL) 10 MG tablet TAKE ONE TABLET BY MOUTH EVERY 8 HOURS AS NEEDED FOR MUSCLE SPASMS  60 tablet  0  . fluticasone (FLONASE) 50 MCG/ACT nasal spray USE ONE SPRAY EACH NOSTRIL EVERY DAY  16 g  2  . furosemide (LASIX) 40 MG tablet Take 1 tablet (40 mg total) by mouth 2 (two) times daily.  60 tablet  11  . loratadine (CLARITIN) 10 MG tablet Take 10 mg by mouth daily as needed. For seasonal allergies      . metoprolol (LOPRESSOR) 100 MG tablet Take 1 tablet (100 mg total) by mouth 2 (two) times  daily.  60 tablet  11  . omeprazole (PRILOSEC) 20 MG capsule Take 1 capsule (20 mg total) by mouth daily.  30 capsule  5  . polysaccharide iron (NIFEREX) 150 MG CAPS capsule Take 1 capsule (150 mg total) by mouth daily.  30 each  5  . potassium chloride (K-DUR) 10 MEQ tablet Take 1 tablet (10 mEq total) by mouth daily.      Marland Kitchen venlafaxine (EFFEXOR-XR) 75 MG 24 hr capsule Take 1 capsule (75 mg total) by mouth daily.  30 capsule  2  . warfarin (COUMADIN) 5 MG tablet Take 1-1.5 tablets (5-7.5 mg total) by mouth daily.  45 tablet  0  . lidocaine (XYLOCAINE) 2 % solution 2 teaspoons swish and spit 3 times daily as needed for mouth pain.  200 mL  0    BP 110/80  Pulse 90  Temp(Src) 97.8 F (36.6 C) (Oral)  Resp 18  Ht 5' (1.524 m)  Wt 192 lb 1.9 oz (87.145 kg)  BMI 37.52 kg/m2  SpO2 98%       Objective:   Physical Exam  Constitutional: She is oriented to person, place, and time. She appears well-developed and well-nourished. No distress.  HENT:  Head: Normocephalic and atraumatic.    Neck: Normal range of motion. Neck supple.  Cardiovascular: Normal rate and regular rhythm.   No murmur heard.      Mechanical click  Pulmonary/Chest: Effort normal and breath sounds normal. No respiratory distress. She has no wheezes. She has no rales. She exhibits no tenderness.  Musculoskeletal: She exhibits no edema.  Neurological: She is alert and oriented to person, place, and time.  Skin: Skin is warm and dry.       Dry rash noted on left upper extremity- eczematous.  Psychiatric: She has a normal mood and affect. Her behavior is normal. Judgment and thought content normal.          Assessment & Plan:

## 2011-05-27 NOTE — Assessment & Plan Note (Signed)
Clinically improved after transfusion while hospitalized in early January and ongoing treatment with iron.  Obtain CBC.

## 2011-05-27 NOTE — Assessment & Plan Note (Signed)
Stable, post injection.

## 2011-05-27 NOTE — Assessment & Plan Note (Signed)
Recommended trial of hydrocortisone 1% OTC.

## 2011-05-27 NOTE — Telephone Encounter (Signed)
Meds refilled at pt's office visit today.

## 2011-05-27 NOTE — Assessment & Plan Note (Signed)
Pt is stable on Wellbutrin.  Continue same.

## 2011-05-29 ENCOUNTER — Encounter: Payer: Self-pay | Admitting: Family

## 2011-06-01 ENCOUNTER — Telehealth: Payer: Self-pay | Admitting: *Deleted

## 2011-06-01 MED ORDER — ALPRAZOLAM 0.25 MG PO TABS: ORAL_TABLET | ORAL | Status: DC

## 2011-06-01 NOTE — Telephone Encounter (Signed)
Notified pt and called Alprazolam 0.25mg  1 tab prior to flight and 1 every 8 hours as needed for anxiety. Advised pt to caution driving after taking medication as it may cause drowsiness. Rx left on pharmacy voicemail.

## 2011-06-01 NOTE — Telephone Encounter (Signed)
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 Will have to take multiple flights and is very anxious about flying as she states she has never flown before. Reports her job situation may be changing, may have to move back to [place]; has been given a large work load recently and she has had increase in anxiety since that time. Feels anxious all the time now and is very irritable. Pt doesn't feel her depression is a problem and reports compliance with medications. Pt is requesting something to help with her anxiety and upcoming flight.  Please advise.

## 2011-06-01 NOTE — Telephone Encounter (Signed)
Please phone in alprazolam as pended below.

## 2011-06-02 ENCOUNTER — Ambulatory Visit (HOSPITAL_COMMUNITY): Payer: Managed Care, Other (non HMO) | Attending: Cardiology

## 2011-06-02 ENCOUNTER — Other Ambulatory Visit: Payer: Self-pay

## 2011-06-02 DIAGNOSIS — I059 Rheumatic mitral valve disease, unspecified: Secondary | ICD-10-CM | POA: Insufficient documentation

## 2011-06-02 DIAGNOSIS — E785 Hyperlipidemia, unspecified: Secondary | ICD-10-CM | POA: Insufficient documentation

## 2011-06-02 DIAGNOSIS — I10 Essential (primary) hypertension: Secondary | ICD-10-CM | POA: Insufficient documentation

## 2011-06-02 DIAGNOSIS — I359 Nonrheumatic aortic valve disorder, unspecified: Secondary | ICD-10-CM

## 2011-06-04 ENCOUNTER — Ambulatory Visit (INDEPENDENT_AMBULATORY_CARE_PROVIDER_SITE_OTHER): Payer: Managed Care, Other (non HMO) | Admitting: Family Medicine

## 2011-06-04 VITALS — BP 140/80

## 2011-06-04 DIAGNOSIS — M545 Low back pain, unspecified: Secondary | ICD-10-CM

## 2011-06-04 MED ORDER — CYCLOBENZAPRINE HCL 10 MG PO TABS: 10.0000 mg | ORAL_TABLET | Freq: Three times a day (TID) | ORAL | Status: AC | PRN

## 2011-06-05 ENCOUNTER — Encounter: Payer: Self-pay | Admitting: Family Medicine

## 2011-06-05 DIAGNOSIS — M545 Low back pain: Secondary | ICD-10-CM | POA: Insufficient documentation

## 2011-06-05 NOTE — Progress Notes (Signed)
Subjective:    Patient ID: Julia Garza, female    DOB: June 10, 1960, 51 y.o.   MRN: [id number]  Back Pain  Knee Pain    51 yo F here for low back pain  06/30/10: Patient reports no known injury. Pain started in November 2011 - was intermittent about that time but has been persistent over past 2 weeks. Pain mostly medial but also posterior. Associated with swelling but no bruising. No catching, locking, or giving out. Tried naprosyn, elevation without much help. No prior right knee pain Had x-rays (non weight bearing) which showed mild medial DJD.  05/07/11: Patient reports that her right knee pain had completely improved up until 1 month ago. Started developing anterior knee pain worse medially and behind kneecap. Has since developed posterior pain as well. Not doing anything for pain (icing, meds, sleeve). Pain worse at end of day and with walking. No catching, locking, giving out. No new trauma. Some mild left knee pain that has started only since right knee has been hurting a lot.  06/04/11: Patient reports her right shoulder and knee are much better since last visit. For past two days however has had worsening low back pain. Started after lifting a lot of boxes as she's moving out of town by the end of the month. No acute injury while doing this but felt low back slowly worsen while doing this. No radiation into legs. No numbness/tingling. No bowel/bladder dysfunction. Using heating pad, some old flexeril. Worse with prolonged sitting.  Past Medical History  Diagnosis Date  . Hypertension   . Hyperlipidemia   . GERD (gastroesophageal reflux disease)   . Depression   . Asthma   . Eczema   . History of rheumatic fever   . Right shoulder injury     history of  . Mitral stenosis with regurgitation   . Syphilis prior 2006    Pt was treated for + RPR/titer  . Herpes     Current Outpatient Prescriptions on File Prior to Visit  Medication Sig Dispense Refill  .  acetaminophen (TYLENOL) 500 MG tablet Take 500 mg by mouth every 6 (six) hours as needed.        . ALPRAZolam (XANAX) 0.25 MG tablet One tablet 30 minutes prior to flying and every 8 hours as needed for anxiety. May cause drowsiness.  30 tablet  0  . amLODipine (NORVASC) 5 MG tablet Take 1 tablet (5 mg total) by mouth daily.  30 tablet  11  . buPROPion (WELLBUTRIN SR) 150 MG 12 hr tablet Take 1 tablet (150 mg total) by mouth 2 (two) times daily.  60 tablet  5  . estradiol (ESTRACE) 1 MG tablet One tablet by mouth daily for 21 days, followed by 7 days off- then repeat.  30 tablet  5  . fluticasone (FLONASE) 50 MCG/ACT nasal spray USE ONE SPRAY EACH NOSTRIL EVERY DAY  16 g  2  . furosemide (LASIX) 40 MG tablet Take 1 tablet (40 mg total) by mouth 2 (two) times daily.  60 tablet  11  . gabapentin (NEURONTIN) 300 MG capsule Take 300 mg by mouth daily.      Marland Kitchen lidocaine (XYLOCAINE) 2 % solution 2 teaspoons swish and spit 3 times daily as needed for mouth pain.  200 mL  0  . loratadine (CLARITIN) 10 MG tablet Take 10 mg by mouth daily as needed. For seasonal allergies      . metoprolol (LOPRESSOR) 100 MG tablet Take 1 tablet (100 mg  total) by mouth 2 (two) times daily.  60 tablet  11  . omeprazole (PRILOSEC) 20 MG capsule Take 1 capsule (20 mg total) by mouth daily.  30 capsule  5  . polysaccharide iron (NIFEREX) 150 MG CAPS capsule Take 1 capsule (150 mg total) by mouth daily.  30 each  5  . potassium chloride (K-DUR) 10 MEQ tablet Take 1 tablet (10 mEq total) by mouth daily.      Marland Kitchen venlafaxine (EFFEXOR-XR) 75 MG 24 hr capsule Take 1 capsule (75 mg total) by mouth daily.  30 capsule  2  . warfarin (COUMADIN) 5 MG tablet Take 1-1.5 tablets (5-7.5 mg total) by mouth daily.  45 tablet  0  . zolpidem (AMBIEN) 5 MG tablet Take 1 tablet (5 mg total) by mouth at bedtime as needed for sleep.  30 tablet  0    Past Surgical History  Procedure Date  . Cesarean section 1991  . Abdominal hysterectomy 01/2004     partial  . Breast surgery 05/2007    left breast biposy  . Femur fracture surgery 1983    right  . 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 Marital Status: Single    Spouse Name: N/A    Number of Children: 3  . Years of Education: N/A   Occupational History  . SALES    Social History Main Topics  . Smoking status: Former Games developer  . Smokeless tobacco: Not on file   Comment: briefly as teenager  . Alcohol Use: No  . Drug Use: No  . Sexually Active: No   Other Topics Concern  . Not on file   Social History Narrative   Regular exercise: yes    Family History  Problem Relation Age of Onset  . Heart disease Mother   . Kidney disease Mother   . Diabetes Mother   . Thyroid disease Mother   . Colon polyps Mother   . Heart attack Mother   . Hypertension Mother   . Heart disease Father   . Heart attack Father     BP 140/80  Review of Systems  Musculoskeletal: Positive for back pain.   See HPI above.    Objective:   Physical Exam  Gen: NAD  Back: No gross deformity, scoliosis. TTP R>L paraspinal regions.  No midline or bony TTP. FROM with pain on extension and lateral rotations.  No pain on flexion. Strength LEs 5/5 all muscle groups.   2+ MSRs in patellar and achilles tendons, equal bilaterally. Negative SLRs. Sensation intact to light touch bilaterally. Negative logroll bilateral hips Negative fabers and piriformis stretches.     Assessment & Plan:  1. Low back pain- 2/2 lumbar strain.  Shown home exercise program to do regularly for next 6 weeks.  Refilled flexeril.  Take tylenol as needed.  Avoiding nsaids as she's on chronic coumadin therapy.  She declined PT as she's going out of town by the end of the month but would consider this if not improving as expected.  See instructions for further.  F/u prn.

## 2011-06-05 NOTE — Assessment & Plan Note (Signed)
2/2 lumbar strain.  Shown home exercise program to do regularly for next 6 weeks.  Refilled flexeril.  Take tylenol as needed.  Avoiding nsaids as she's on chronic coumadin therapy.  She declined PT as she's going out of town by the end of the month but would consider this if not improving as expected.  See instructions for further.  F/u prn.

## 2011-06-12 ENCOUNTER — Ambulatory Visit (INDEPENDENT_AMBULATORY_CARE_PROVIDER_SITE_OTHER): Payer: Managed Care, Other (non HMO) | Admitting: *Deleted

## 2011-06-12 DIAGNOSIS — Z954 Presence of other heart-valve replacement: Secondary | ICD-10-CM

## 2011-06-12 DIAGNOSIS — Z7901 Long term (current) use of anticoagulants: Secondary | ICD-10-CM

## 2011-06-12 DIAGNOSIS — I052 Rheumatic mitral stenosis with insufficiency: Secondary | ICD-10-CM

## 2011-06-12 LAB — POCT INR: INR: 4.5

## 2011-06-12 MED ORDER — WARFARIN SODIUM 5 MG PO TABS: ORAL_TABLET | ORAL | Status: DC

## 2011-06-24 ENCOUNTER — Ambulatory Visit (INDEPENDENT_AMBULATORY_CARE_PROVIDER_SITE_OTHER): Payer: Managed Care, Other (non HMO) | Admitting: *Deleted

## 2011-06-24 ENCOUNTER — Other Ambulatory Visit: Payer: Self-pay | Admitting: Family

## 2011-06-24 DIAGNOSIS — Z954 Presence of other heart-valve replacement: Secondary | ICD-10-CM

## 2011-06-24 DIAGNOSIS — Z7901 Long term (current) use of anticoagulants: Secondary | ICD-10-CM

## 2011-06-24 DIAGNOSIS — I052 Rheumatic mitral stenosis with insufficiency: Secondary | ICD-10-CM

## 2011-06-24 NOTE — Telephone Encounter (Signed)
Zolpidem 5mg  1 tablet at bedtime as needed, #30 x no refills left on pharmacy voicemail.

## 2011-07-01 ENCOUNTER — Other Ambulatory Visit: Payer: Self-pay | Admitting: *Deleted

## 2011-07-01 MED ORDER — ALPRAZOLAM 0.25 MG PO TABS: ORAL_TABLET | ORAL | Status: DC

## 2011-07-01 NOTE — Telephone Encounter (Signed)
Received message from pt that she has relocated to [place] for her job. Pt states the xanax worked very well for her anxiety and she is requesting a refill.  Please advise.

## 2011-07-01 NOTE — Telephone Encounter (Signed)
Per Sandford Craze, NP ok to call in #30 x no refills. Refill left on pharmacy voicemail at [facility]).  Pt wanted to know if you knew of any Internal Medicine providers in her area?

## 2011-07-01 NOTE — Telephone Encounter (Signed)
Sorry, I am not familiar with any providers there. Please let her know that I wish her well in [place].

## 2011-07-02 NOTE — Telephone Encounter (Signed)
Call placed to patient at [phone], she was informed per Sandford Craze instructions.

## 2011-07-23 ENCOUNTER — Other Ambulatory Visit: Payer: Self-pay | Admitting: Pharmacist

## 2011-07-23 ENCOUNTER — Other Ambulatory Visit: Payer: Self-pay | Admitting: *Deleted

## 2011-07-23 MED ORDER — VENLAFAXINE HCL ER 75 MG PO CP24: 75.0000 mg | ORAL_CAPSULE | Freq: Every day | ORAL | Status: AC

## 2011-07-23 MED ORDER — WARFARIN SODIUM 5 MG PO TABS: ORAL_TABLET | ORAL | Status: AC

## 2011-07-23 NOTE — Telephone Encounter (Signed)
Received call from pt requesting refill on Venlafaxine. Has appt with new MD in [place] for 08/03/11. Refill sent to [facility]. Left detailed message on pt's cell# of rx completion and to call if any questions.

## 2011-07-29 ENCOUNTER — Encounter: Payer: Managed Care, Other (non HMO) | Admitting: Cardiology

## 2011-07-29 ENCOUNTER — Encounter: Payer: Self-pay | Admitting: Family

## 2011-07-29 MED ORDER — ALPRAZOLAM 0.25 MG PO TABS: ORAL_TABLET | ORAL | Status: AC

## 2011-07-29 NOTE — Progress Notes (Signed)
ZOX:[id number] female for f/u of MS/MR and MVR. On July 17, 2010 the patient had right miniature thoracotomy for mitral valve replacement (29-mm [employer] mechanical prosthesis). Note preoperative cardiac catheterization showed no coronary disease. Patient readmitted May 2012 with fever. Blood cultures were negative. There was a small fluid collection in the anterior chest that was aspirated which was negative. 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 The patient did have recurrent fevers but workup negative. This included an evaluation by Dr. Maurice March. Followup chest CT in May of 2012 showed no change in her fluid collection. Fu chest CT in Oct 2012 showed fluid collection had decreased in size. Last echocardiogram performed in March of 2013. Ejection fraction was 55-60%. There was a mechanical mitral prosthesis with mild mitral regurgitation; mean gradient 7 mm of mercury. There was mild aortic insufficiency. I last saw her in Jan 2013. The patient noticed increased dyspnea and hemoglobin was found to be 5.9. Patient was transfused. EGD and colonoscopy were negative. It was felt she might require capsule endoscopy. She has also been placed on iron. Last hemoglobin in February had improved to 11. Since she was last seen,    Current Outpatient Prescriptions  Medication Sig Dispense Refill  . acetaminophen (TYLENOL) 500 MG tablet Take 500 mg by mouth every 6 (six) hours as needed.        . ALPRAZolam (XANAX) 0.25 MG tablet One tablet 30 minutes prior to flying and every 8 hours as needed for anxiety. May cause drowsiness.  30 tablet  0  . amLODipine (NORVASC) 5 MG tablet Take 1 tablet (5 mg total) by mouth daily.  30 tablet  11  . buPROPion (WELLBUTRIN SR) 150 MG 12 hr tablet Take 1 tablet (150 mg total) by mouth 2 (two) times daily.  60 tablet  5  . cyclobenzaprine (FLEXERIL) 10 MG tablet Take 1 tablet (10 mg total) by mouth 3 (three) times daily as needed for muscle spasms.  90 tablet  1  . estradiol (ESTRACE) 1 MG  tablet One tablet by mouth daily for 21 days, followed by 7 days off- then repeat.  30 tablet  5  . fluticasone (FLONASE) 50 MCG/ACT nasal spray USE ONE SPRAY EACH NOSTRIL EVERY DAY  16 g  2  . furosemide (LASIX) 40 MG tablet Take 1 tablet (40 mg total) by mouth 2 (two) times daily.  60 tablet  11  . gabapentin (NEURONTIN) 300 MG capsule Take 300 mg by mouth daily.      Marland Kitchen lidocaine (XYLOCAINE) 2 % solution 2 teaspoons swish and spit 3 times daily as needed for mouth pain.  200 mL  0  . loratadine (CLARITIN) 10 MG tablet Take 10 mg by mouth daily as needed. For seasonal allergies      . metoprolol (LOPRESSOR) 100 MG tablet Take 1 tablet (100 mg total) by mouth 2 (two) times daily.  60 tablet  11  . omeprazole (PRILOSEC) 20 MG capsule Take 1 capsule (20 mg total) by mouth daily.  30 capsule  5  . polysaccharide iron (NIFEREX) 150 MG CAPS capsule Take 1 capsule (150 mg total) by mouth daily.  30 each  5  . potassium chloride (K-DUR) 10 MEQ tablet Take 1 tablet (10 mEq total) by mouth daily.      Marland Kitchen venlafaxine XR (EFFEXOR-XR) 75 MG 24 hr capsule Take 1 capsule (75 mg total) by mouth daily.  30 capsule  1  . warfarin (COUMADIN) 5 MG tablet Take as directed per coumadin clinic  45 tablet  0  . zolpidem (AMBIEN) 5 MG tablet TAKE ONE TABLET BY MOUTH AT BEDTIME AS NEEDED FOR SLEEP  30 tablet  1     Past Medical History  Diagnosis Date  . Hypertension   . Hyperlipidemia   . GERD (gastroesophageal reflux disease)   . Depression   . Asthma   . Eczema   . History of rheumatic fever   . Right shoulder injury     history of  . Mitral stenosis with regurgitation   . Syphilis prior 2006    Pt was treated for + RPR/titer  . Herpes     Past Surgical History  Procedure Date  . Cesarean section 1991  . Abdominal hysterectomy 01/2004    partial  . Breast surgery 05/2007    left breast biposy  . Femur fracture surgery 1983    right  . Mitral valve replacement 07/16/2010    #45mm [employer]  mechanical prosthesis via right mini thoracotomy    History   Social History  . Marital Status: Single    Spouse Name: N/A    Number of Children: 3  . Years of Education: N/A   Occupational History  . SALES    Social History Main Topics  . Smoking status: Former Games developer  . Smokeless tobacco: Not on file   Comment: briefly as teenager  . Alcohol Use: No  . Drug Use: No  . Sexually Active: No   Other Topics Concern  . Not on file   Social History Narrative   Regular exercise: yes    ROS: no fevers or chills, productive cough, hemoptysis, dysphasia, odynophagia, melena, hematochezia, dysuria, hematuria, rash, seizure activity, orthopnea, PND, pedal edema, claudication. Remaining systems are negative.  Physical Exam: Well-developed well-nourished in no acute distress.  Skin is warm and dry.  HEENT is normal.  Neck is supple. No thyromegaly.  Chest is clear to auscultation with normal expansion.  Cardiovascular exam is regular rate and rhythm.  Abdominal exam nontender or distended. No masses palpated. Extremities show no edema. neuro grossly intact  ECG     This encounter was created in error - please disregard.

## 2011-07-29 NOTE — Telephone Encounter (Signed)
OK to send #15 tabs xanax with no refills.

## 2011-07-29 NOTE — Telephone Encounter (Signed)
I had already left # 30 tabs on pharmacy voicemail as that was what we refilled last time. Pt was already notified and aware that is our last refill.

## 2011-09-08 ENCOUNTER — Other Ambulatory Visit: Payer: Self-pay | Admitting: Cardiology

## 2011-09-09 NOTE — Telephone Encounter (Signed)
Fax Received. Refill Completed. Julia Garza (R.M.A)   

## 2011-12-02 ENCOUNTER — Other Ambulatory Visit: Payer: Self-pay | Admitting: Family

## 2011-12-03 NOTE — Telephone Encounter (Signed)
Estradiol refill denied as pt has relocated and is no longer under our care.

## 2011-12-31 ENCOUNTER — Other Ambulatory Visit: Payer: Self-pay | Admitting: Cardiology

## 2012-02-10 IMAGING — CR DG CHEST 1V PORT
1 series · 1 of 1 positions shown · non-contrast
Comparison: 07/16/2010.

CLINICAL DATA: Post mitral valve replacement.

PORTABLE CHEST - 1 VIEW

[view not recorded]
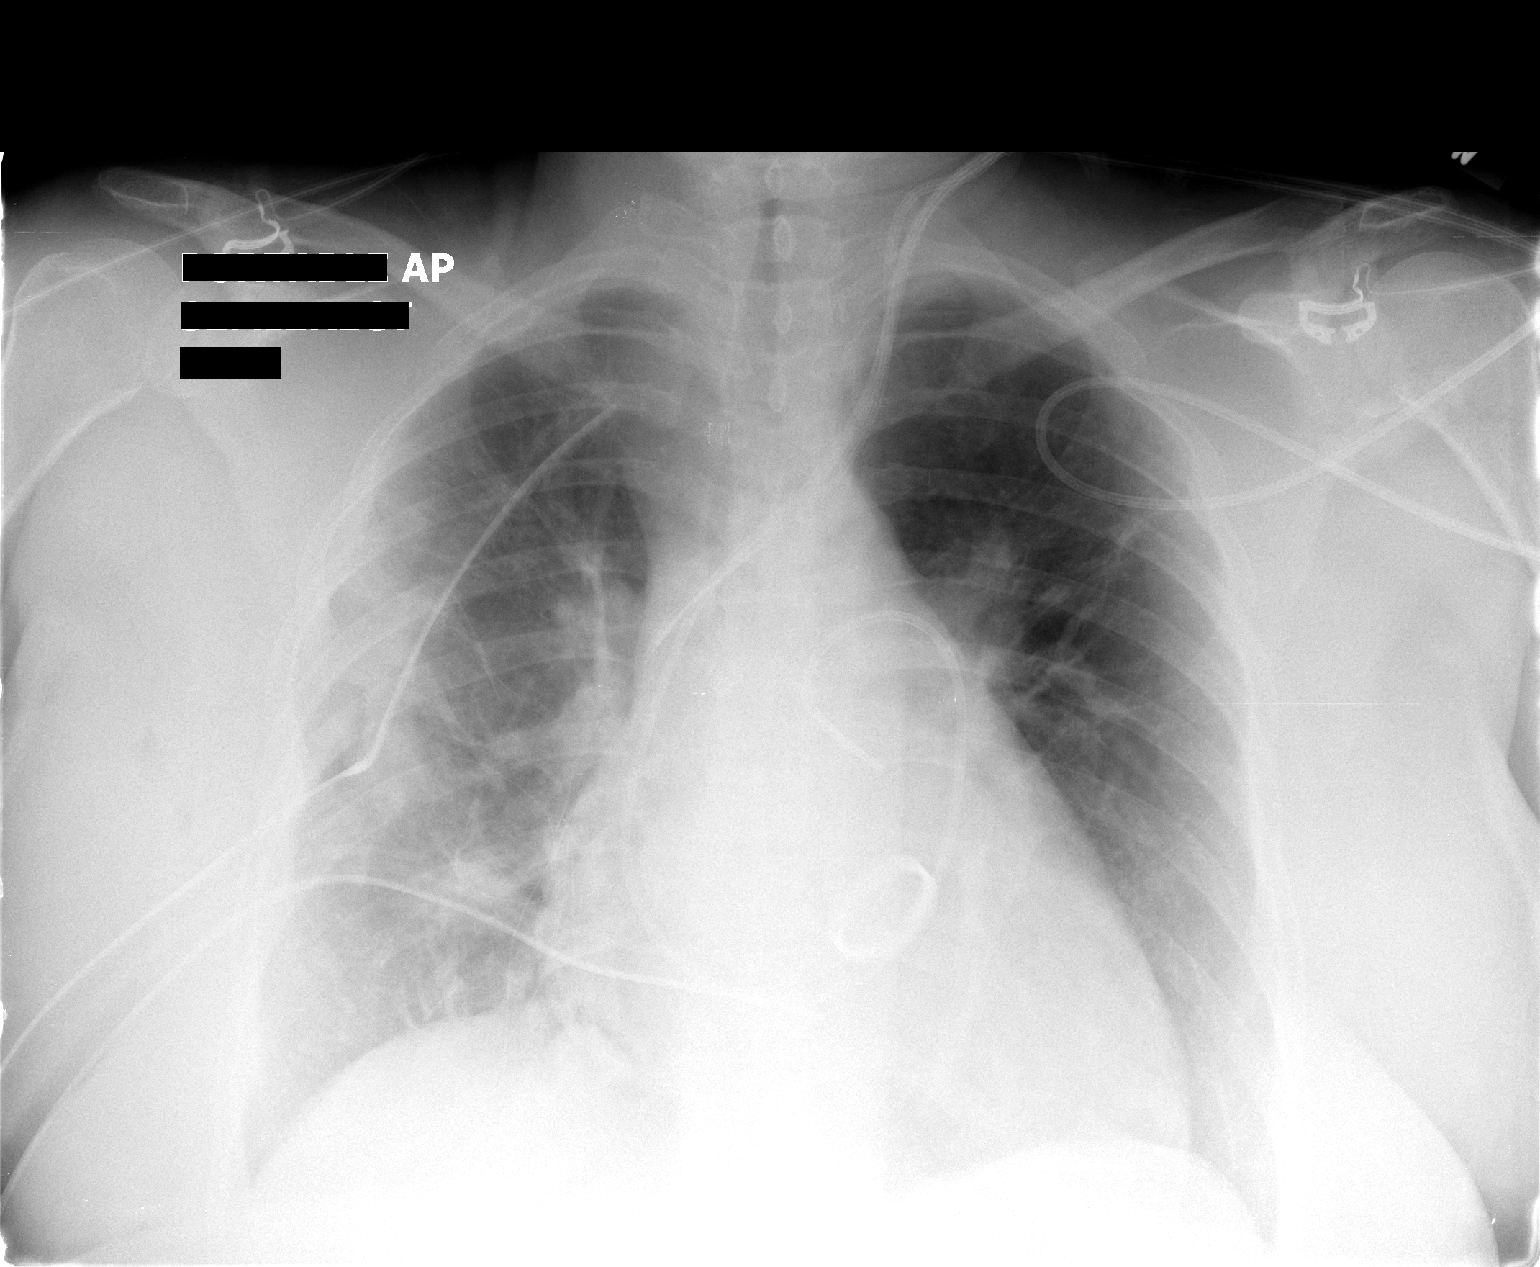

[1 of 1 positions shown; findings below may reference images not displayed]

FINDINGS: Endotracheal tube and nasogastric tube have been removed.

Left central line tip proximal to mid superior vena cava.  Left-
sided Swan-Ganz catheter tip curled projecting over the main
pulmonary artery region.  This may need to be repositioned.

Two right-sided chest tubes are in place.  No gross pneumothorax.
Overlying structures right lung base may represent leads outside
the patient.  Difficult to exclude anteriorly located right-sided
pneumothorax.

Peripheral consolidation right mid to lower lung zone.  This may
represent atelectasis.  Pulmonary vascular prominence most notable
centrally.  Cardiomegaly.
IMPRESSION: Endotracheal tube and nasogastric tube have been removed.

Swan-Ganz catheter remains curled projecting over the main
pulmonary artery region.  This may need to be repositioned.

## 2012-02-11 IMAGING — CR DG CHEST 2V
2 series · 2 of 2 positions shown · non-contrast
Comparison: 07/17/2010.

CLINICAL DATA: CABG.  Valve replacement.

CHEST - 2 VIEW

[w chest pa]
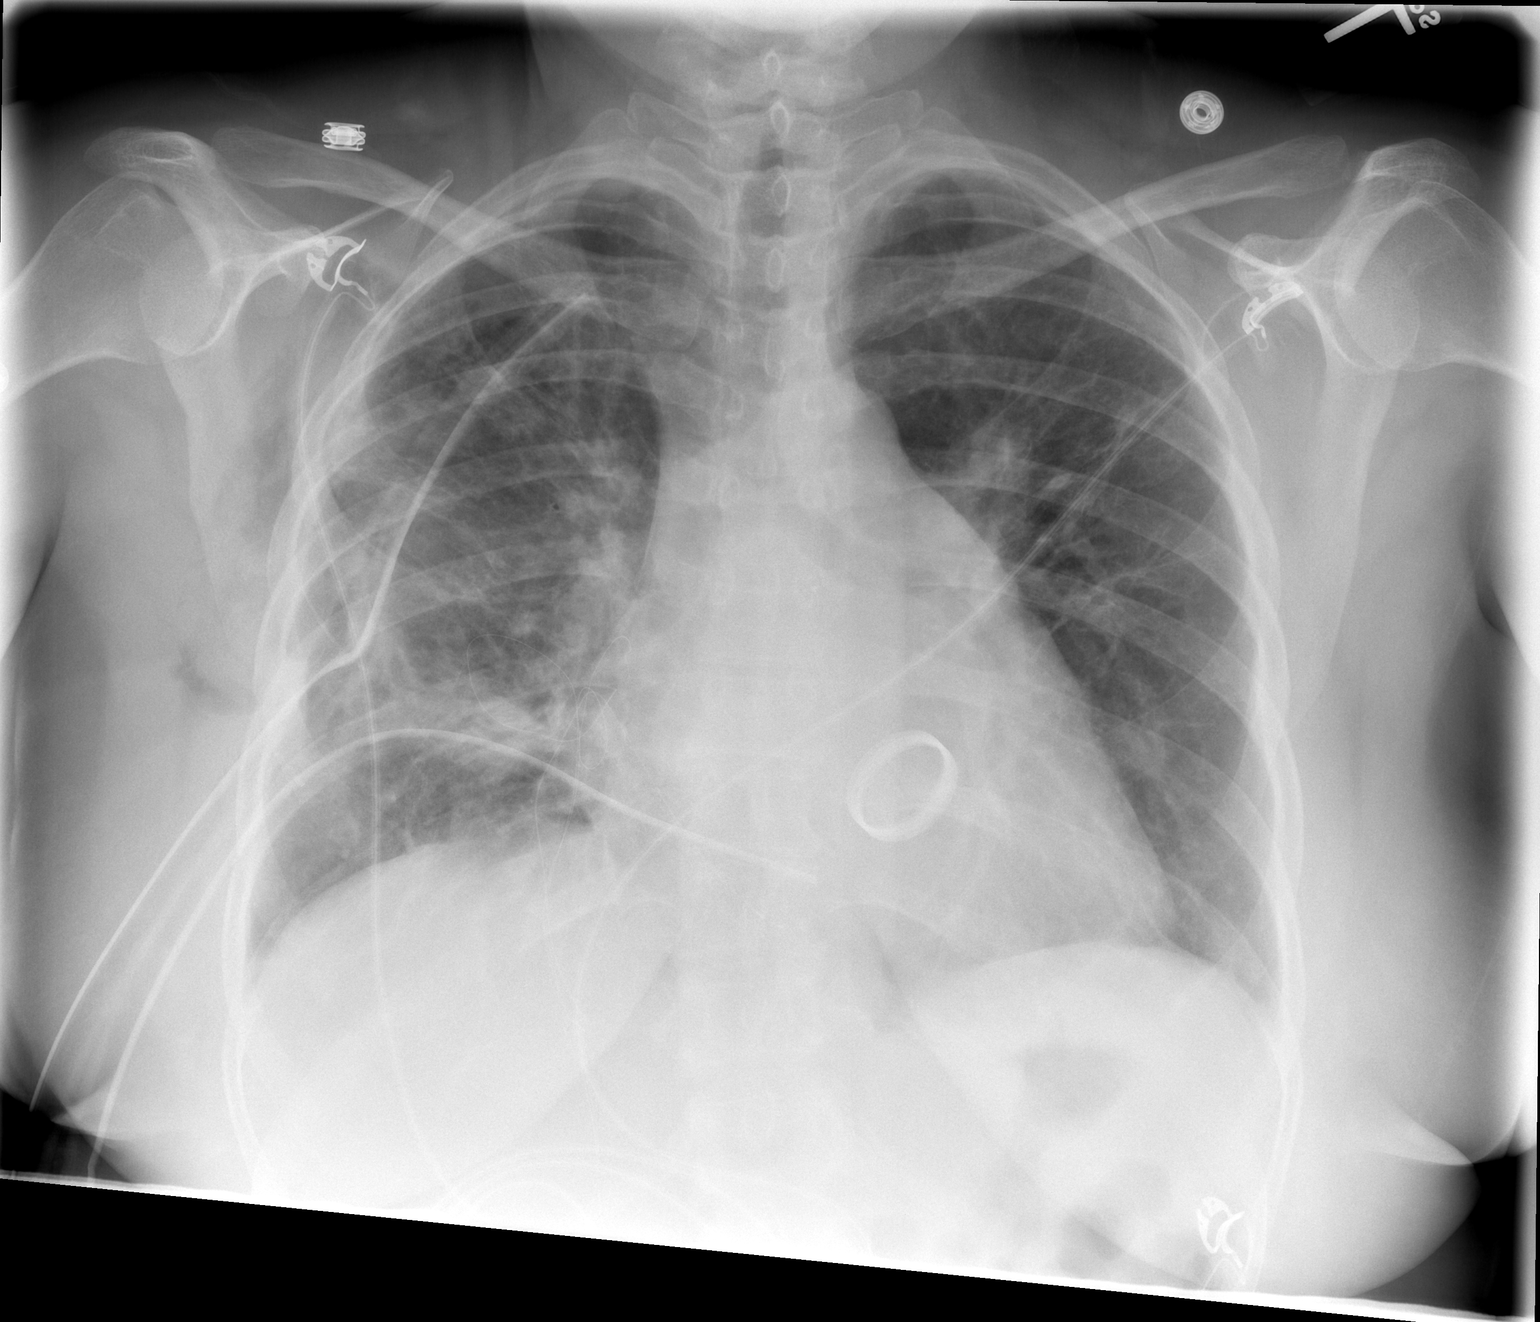

[w chest lat]
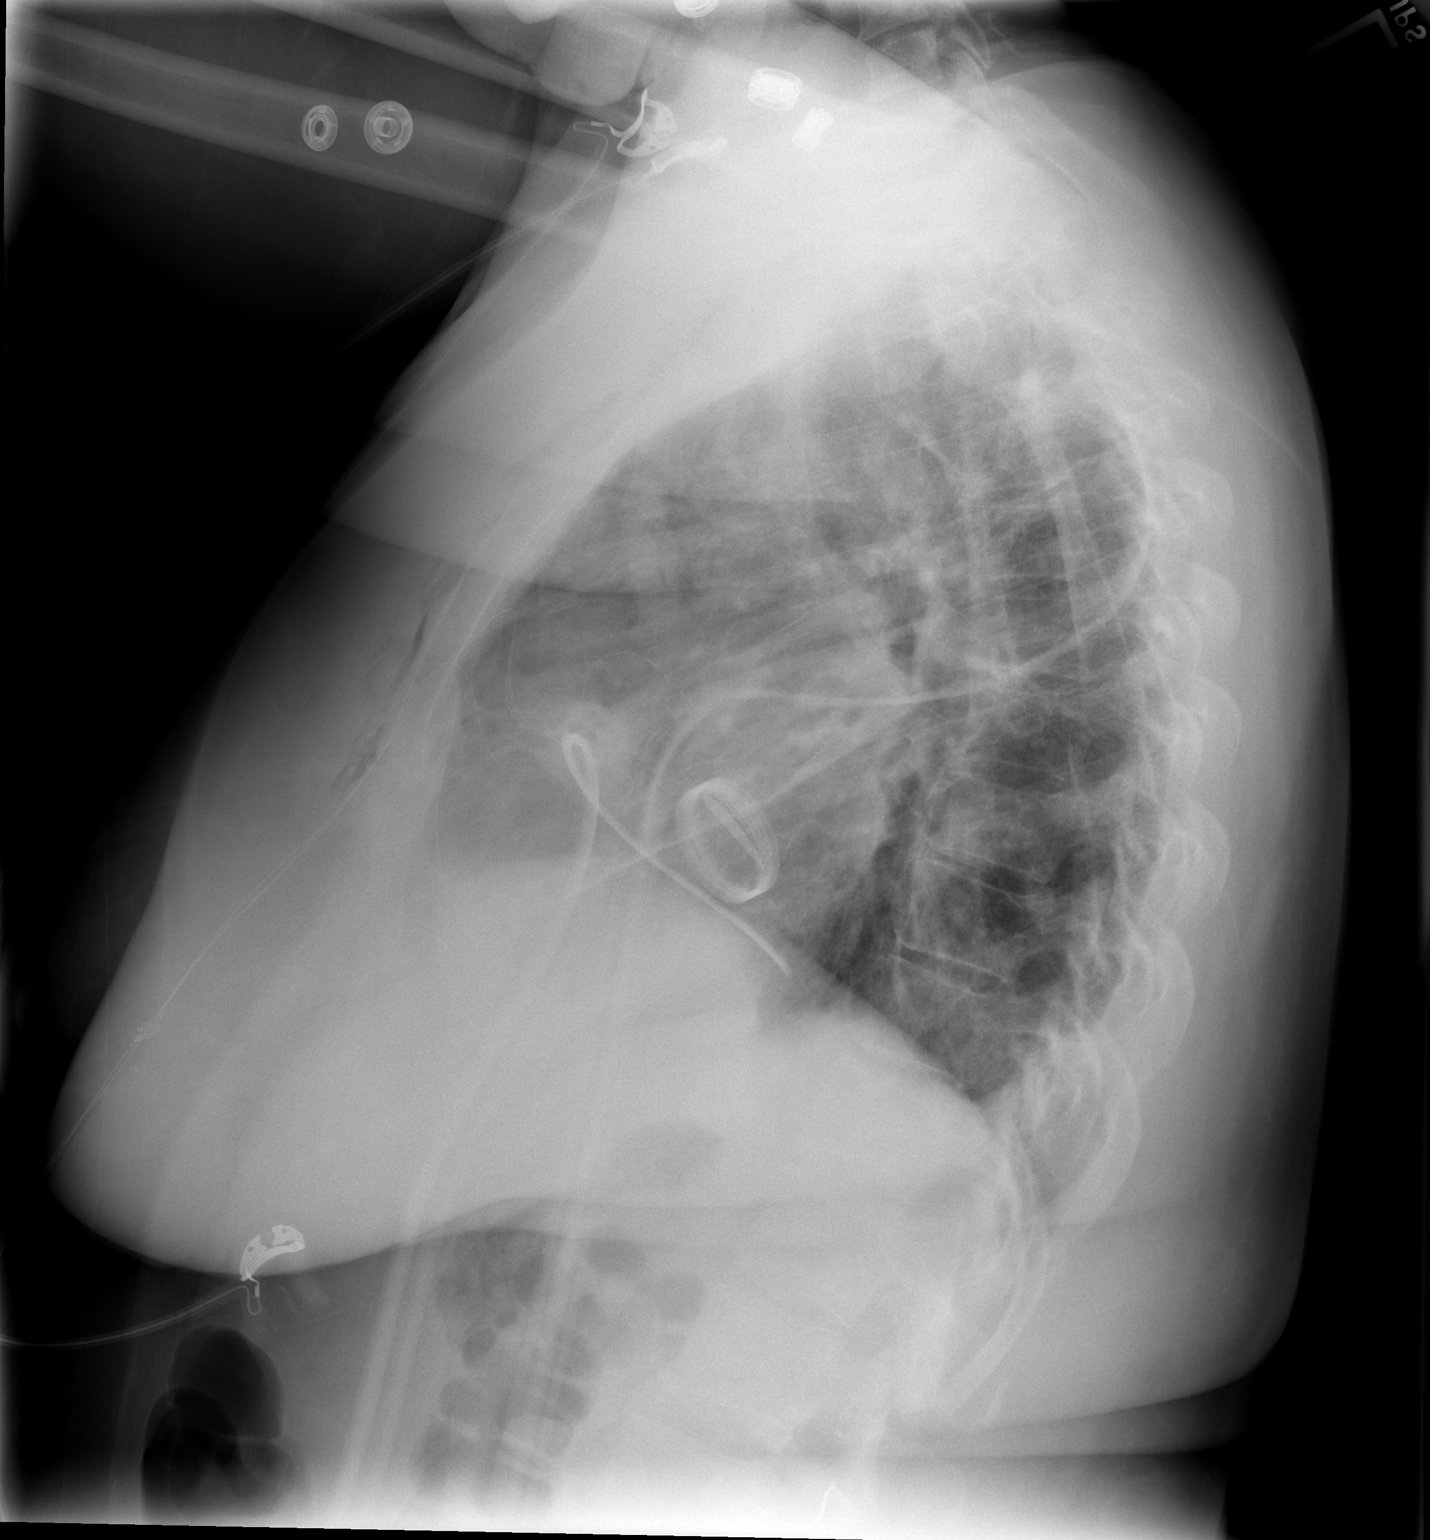

[2 of 2 positions shown; findings below may reference images not displayed]

FINDINGS: Mitral valve replacement is present.  The left IJ central
line and Swan-Ganz catheter have been removed.  The right
thoracostomy tubes remain in place with mild pleural reaction
around the tubes and some atelectasis.  Mild subcutaneous emphysema
along the right chest wall.  No right pneumothorax or pleural
effusion.  Epicardial pacing leads noted.  Scattered areas of
subsegmental atelectasis are present in the left lung.
Cardiopericardial silhouette appears unchanged allowing for
differences in technique.
IMPRESSION: 1.  Interval removal of left IJ central line and Swan Ganz
catheter.
2.  Unchanged right thoracostomy tubes without pneumothorax.
3.  Scattered areas of atelectasis.  No effusion or airspace
disease.

## 2012-03-08 ENCOUNTER — Other Ambulatory Visit: Payer: Self-pay | Admitting: Cardiology

## 2012-03-13 IMAGING — CR DG CHEST 2V
2 series · 2 of 2 positions shown · non-contrast
Comparison: Two-view chest x-ray and CT chest 08/04/2010.  Two-view
chest x-ray 07/28/2010.

CLINICAL DATA: Mitral valve replacement 07/16/2010.  Persistent
chest pain.

CHEST - 2 VIEW 08/18/2010:

[view not recorded (1 of 2)]
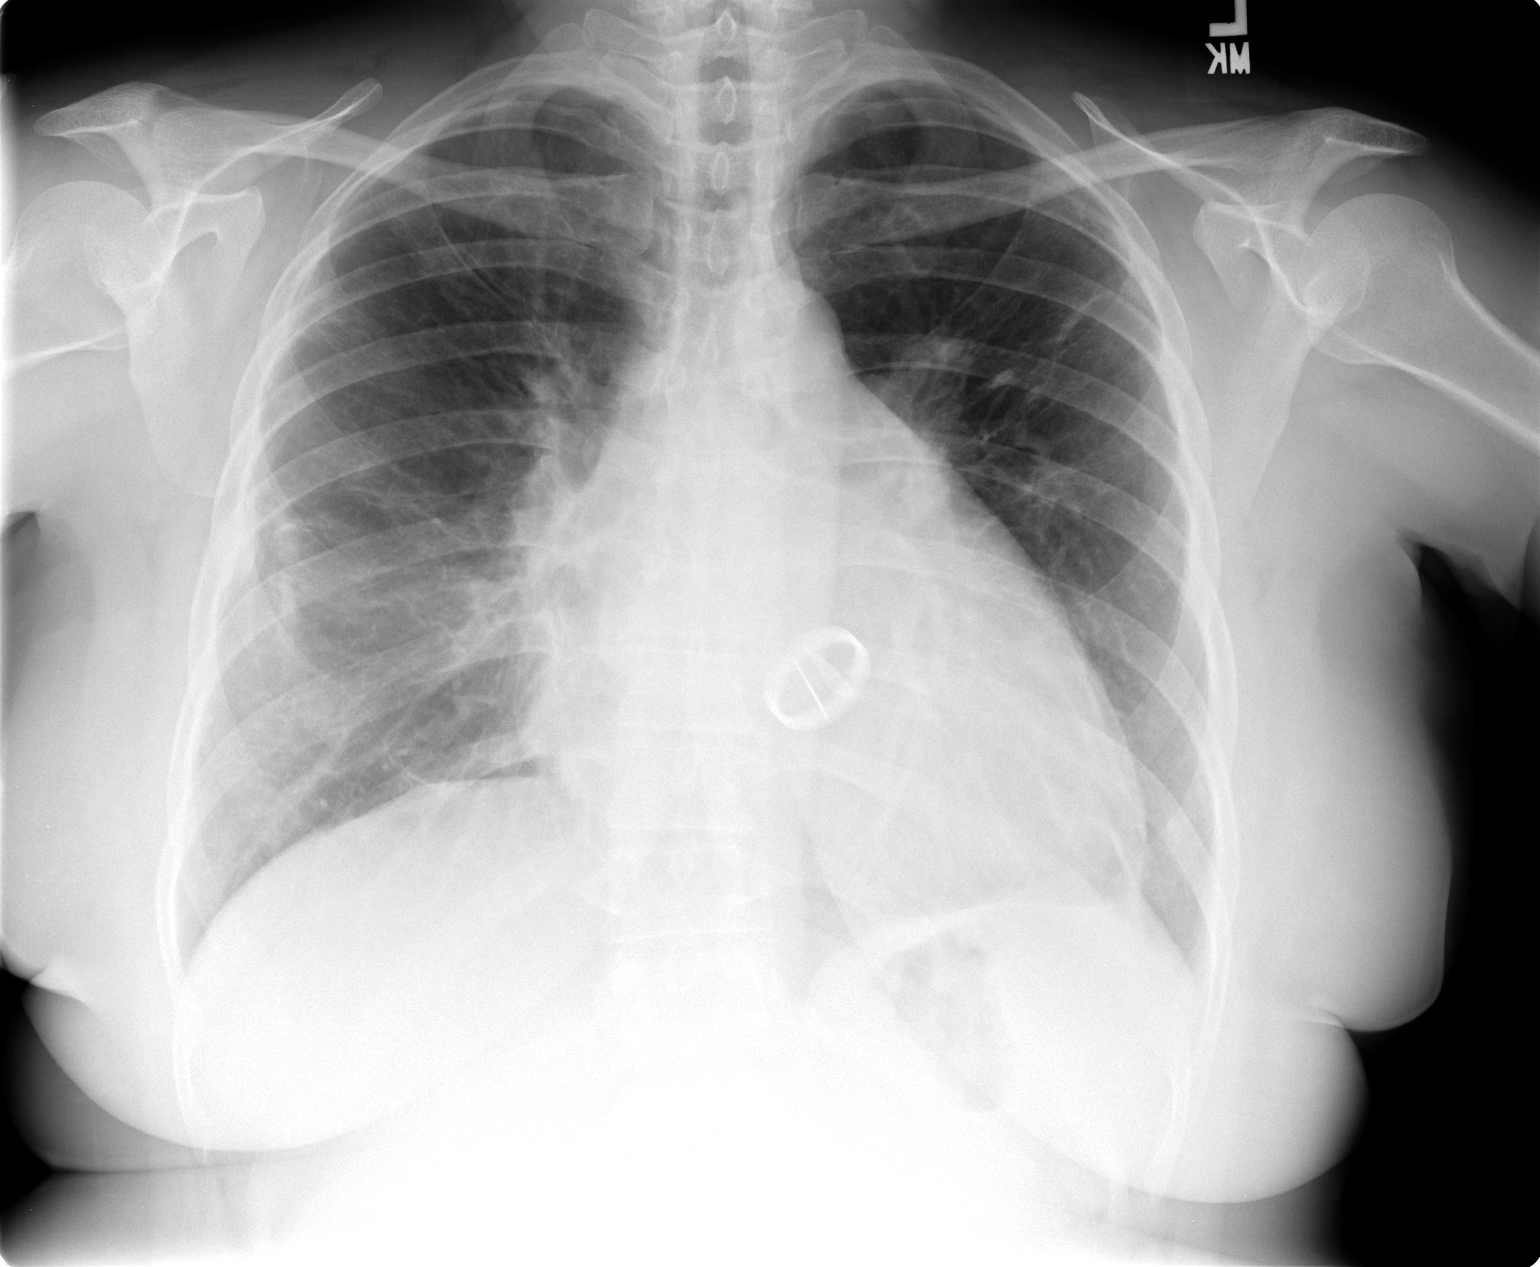

[view not recorded (2 of 2)]
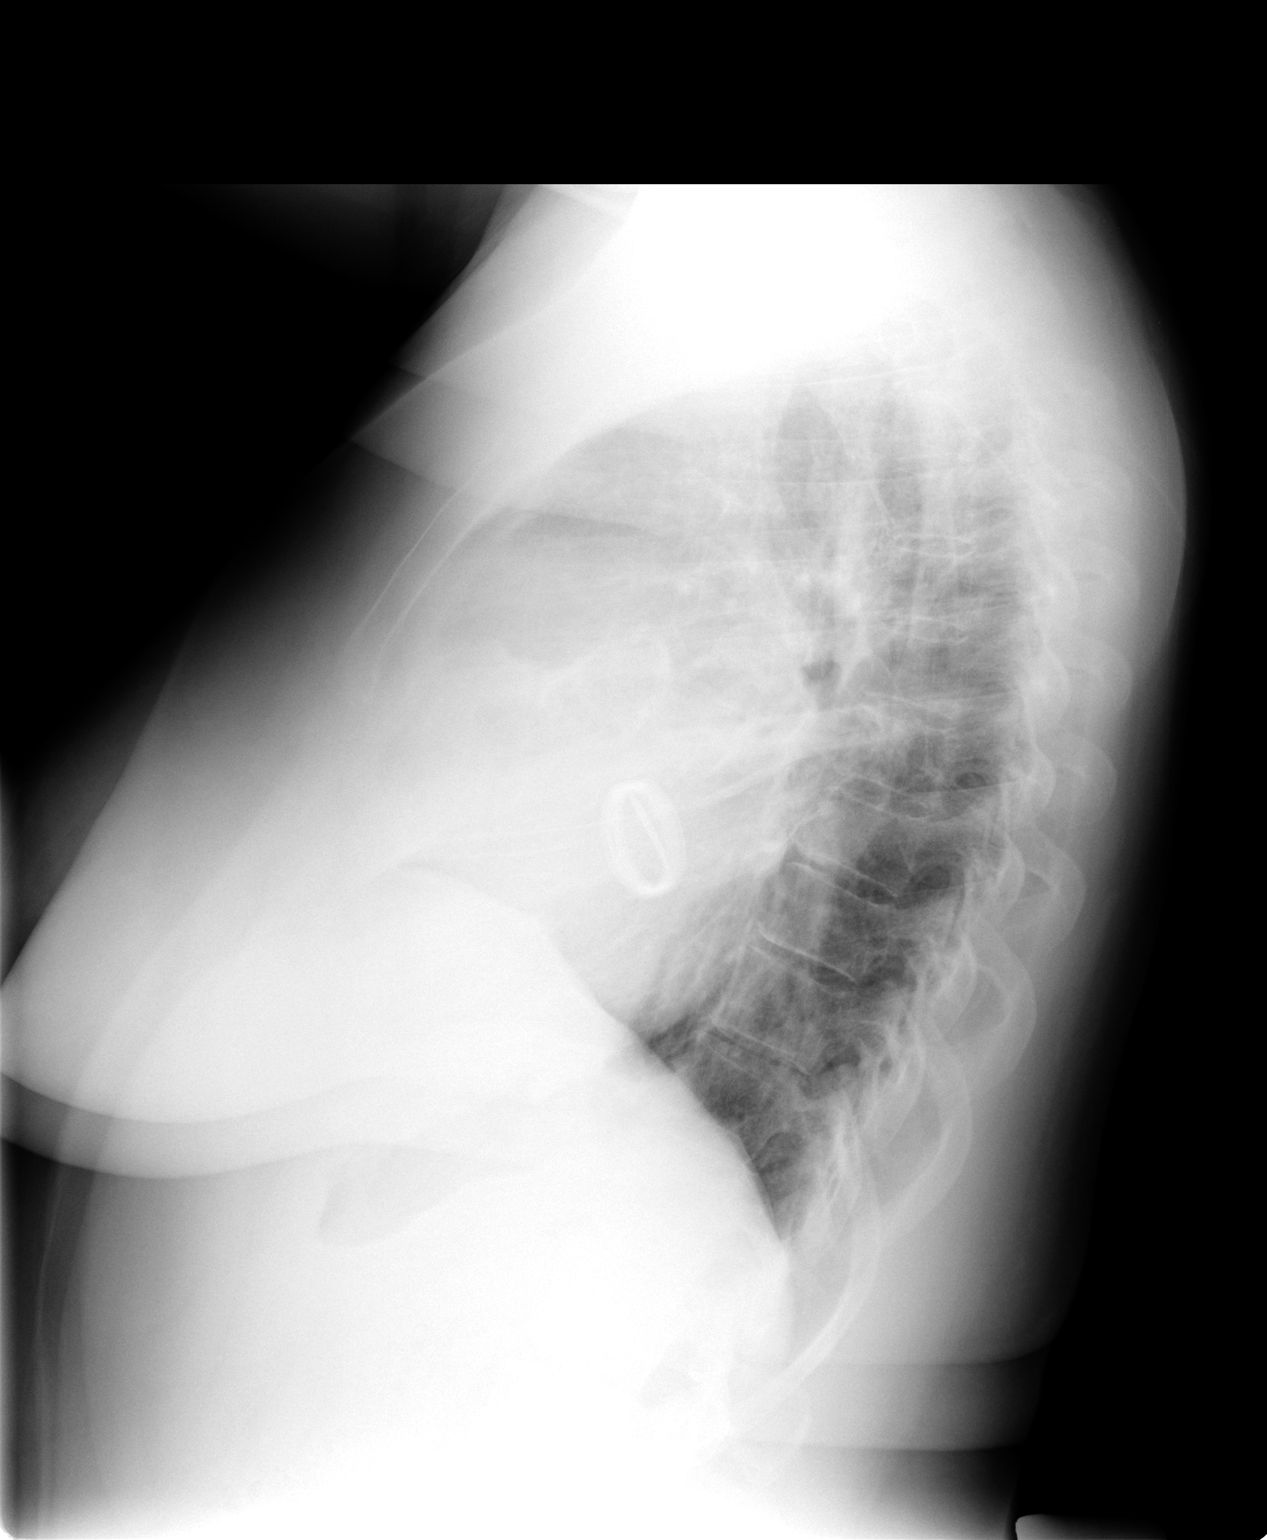

[2 of 2 positions shown; findings below may reference images not displayed]

FINDINGS: Stable marked enlargement of the cardiac silhouette.
Mitral valve replacement.  Pleuroparenchymal scarring in the right
mid lung, unchanged.  Stable linear scarring in the left upper lobe
and in both lower lobes.  No new pulmonary parenchymal
abnormalities.  No pleural effusions.  Pulmonary vascularity
normal.  Visualized bony thorax intact.
IMPRESSION: Stable marked cardiomegaly without pulmonary edema.  Stable
pleuroparenchymal scarring in the right mid lung and linear
scarring in the left upper lobe and both lower lobes.  No acute
cardiopulmonary disease.

## 2012-04-07 ENCOUNTER — Other Ambulatory Visit: Payer: Self-pay | Admitting: Cardiology

## 2012-05-14 ENCOUNTER — Other Ambulatory Visit: Payer: Self-pay

## 2013-02-02 ENCOUNTER — Other Ambulatory Visit: Payer: Self-pay

## 2014-09-27 ENCOUNTER — Telehealth: Payer: Self-pay | Admitting: Cardiology

## 2014-09-27 NOTE — Telephone Encounter (Signed)
Spoke to patient - she had received card from Dr. Jens Somrenshaw r/t Mitral valve replacement, instructed not to have MRI's done d/t this. Recently had another physician give her instruction to get an MRI, she told him she could not. Asked if any other concerns - she states none, just wanted to see if copy of card able to be obtained. Informed her I checked chart, don't see that a copy was scanned, would need to ask someone who knows more about this so will defer to Dr. Ludwig Clarksrenshaw's primary nurse.

## 2014-09-27 NOTE — Telephone Encounter (Signed)
Randa EvensJoanne is calling because back in 2012 she had a  Mitrovalve replacement and was given a card  By dr. Jens Somrenshaw stating that she is no to have any MRI's etc.and she needs a copy of that documentation saying that she was told not to have any MRI's . Please call  Thanks

## 2014-09-27 NOTE — Telephone Encounter (Signed)
Spoke with pt, [facility] card mailed to her new home address of [address], [address], [place], [place] [id number]

## 2016-05-28 ENCOUNTER — Ambulatory Visit (INDEPENDENT_AMBULATORY_CARE_PROVIDER_SITE_OTHER): Payer: Self-pay | Admitting: Internal Medicine

## 2016-06-05 ENCOUNTER — Ambulatory Visit (INDEPENDENT_AMBULATORY_CARE_PROVIDER_SITE_OTHER): Payer: BLUE CROSS/BLUE SHIELD | Admitting: Internal Medicine

## 2016-06-05 ENCOUNTER — Encounter (INDEPENDENT_AMBULATORY_CARE_PROVIDER_SITE_OTHER): Payer: Self-pay | Admitting: Internal Medicine

## 2016-06-05 DIAGNOSIS — M4310 Spondylolisthesis, site unspecified: Secondary | ICD-10-CM

## 2016-06-05 DIAGNOSIS — M5126 Other intervertebral disc displacement, lumbar region: Secondary | ICD-10-CM

## 2016-06-05 DIAGNOSIS — M170 Bilateral primary osteoarthritis of knee: Secondary | ICD-10-CM

## 2016-06-05 DIAGNOSIS — F329 Major depressive disorder, single episode, unspecified: Secondary | ICD-10-CM

## 2016-06-05 DIAGNOSIS — D509 Iron deficiency anemia, unspecified: Secondary | ICD-10-CM

## 2016-06-05 DIAGNOSIS — I10 Essential (primary) hypertension: Secondary | ICD-10-CM

## 2016-06-05 DIAGNOSIS — K219 Gastro-esophageal reflux disease without esophagitis: Secondary | ICD-10-CM

## 2016-06-05 DIAGNOSIS — Z952 Presence of prosthetic heart valve: Secondary | ICD-10-CM

## 2016-06-05 DIAGNOSIS — M797 Fibromyalgia: Secondary | ICD-10-CM

## 2016-06-05 DIAGNOSIS — Z7901 Long term (current) use of anticoagulants: Secondary | ICD-10-CM

## 2016-06-05 DIAGNOSIS — M1288 Other specific arthropathies, not elsewhere classified, other specified site: Secondary | ICD-10-CM

## 2016-06-05 DIAGNOSIS — M5416 Radiculopathy, lumbar region: Secondary | ICD-10-CM

## 2016-06-05 DIAGNOSIS — R609 Edema, unspecified: Secondary | ICD-10-CM

## 2016-06-05 DIAGNOSIS — F32A Depression, unspecified: Secondary | ICD-10-CM

## 2016-06-05 DIAGNOSIS — Z90711 Acquired absence of uterus with remaining cervical stump: Secondary | ICD-10-CM

## 2016-06-05 DIAGNOSIS — R7302 Impaired glucose tolerance (oral): Secondary | ICD-10-CM

## 2016-06-05 DIAGNOSIS — E7800 Pure hypercholesterolemia, unspecified: Secondary | ICD-10-CM

## 2016-06-05 DIAGNOSIS — M47816 Spondylosis without myelopathy or radiculopathy, lumbar region: Secondary | ICD-10-CM

## 2016-06-05 DIAGNOSIS — E669 Obesity, unspecified: Secondary | ICD-10-CM

## 2016-06-05 DIAGNOSIS — N951 Menopausal and female climacteric states: Secondary | ICD-10-CM

## 2016-06-05 NOTE — Progress Notes (Signed)
Have you seen any specialists/other providers since your last visit with us?    No      Limb alert protocol reviewed?      Yes
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
## 2016-06-05 NOTE — Patient Instructions (Signed)
Will carefully try to wean off the zoloft. Please start take half of the tablet daily - for 2-3 weeks. Then reduce to half tablet every other day - for another 2-3 weeks. Then reduced to every 3rd day and then stop.     Continue prozac 20 mg for depression and re- assess.    Iron studies today - see if iron supplement can be stopped (at least for now).    Lumbar radiculopathy, left - per Dr. Oda Cogan.

## 2016-06-06 LAB — IRON PROFILE
Iron Saturation: 37 % (ref 15–55)
Iron: 107 ug/dL (ref 27–159)
TIBC: 289 ug/dL (ref 250–450)
UIBC: 182 ug/dL (ref 131–425)

## 2016-06-06 LAB — FERRITIN: Ferritin: 650 ng/mL — ABNORMAL HIGH (ref 15–150)

## 2016-06-08 ENCOUNTER — Encounter (INDEPENDENT_AMBULATORY_CARE_PROVIDER_SITE_OTHER): Payer: Self-pay | Admitting: Internal Medicine

## 2016-06-08 DIAGNOSIS — F32A Depression, unspecified: Secondary | ICD-10-CM | POA: Insufficient documentation

## 2016-06-08 DIAGNOSIS — R609 Edema, unspecified: Secondary | ICD-10-CM | POA: Insufficient documentation

## 2016-06-09 ENCOUNTER — Telehealth (INDEPENDENT_AMBULATORY_CARE_PROVIDER_SITE_OTHER): Payer: Self-pay

## 2016-06-09 NOTE — Telephone Encounter (Signed)
-----   Message from Charlton Amor, MD sent at 06/09/2016 12:12 AM EDT -----  Please advise pt to stop iron supplement, as her iron stores are much higher than normal.   To be certain, please advise to follow up in 4 months or so to re-check her levels.

## 2016-06-09 NOTE — Telephone Encounter (Signed)
Reviewed lab result notes by Dr. Denice Bors with pt. Pt showed understanding of results.  Pt reported that she stopped the iron supplements 3 weeks ago and will F/U in 4 months for recheck.

## 2016-06-09 NOTE — Progress Notes (Signed)
Please advise pt to stop iron supplement, as her iron stores are much higher than normal.   To be certain, please advise to follow up in 4 months or so to re-check her levels.

## 2016-06-16 ENCOUNTER — Encounter (INDEPENDENT_AMBULATORY_CARE_PROVIDER_SITE_OTHER): Payer: Self-pay | Admitting: Internal Medicine

## 2016-06-22 ENCOUNTER — Encounter (INDEPENDENT_AMBULATORY_CARE_PROVIDER_SITE_OTHER): Payer: Self-pay | Admitting: Internal Medicine
